# Patient Record
Sex: Male | Born: 1980 | Race: White | Hispanic: No | Marital: Married | Smoking: Current every day smoker
Health system: Southern US, Community
[De-identification: ages and names within clinical notes are randomized; demographics above are authoritative.]

## PROBLEM LIST (undated history)

## (undated) DIAGNOSIS — B9562 Methicillin resistant Staphylococcus aureus infection as the cause of diseases classified elsewhere: Secondary | ICD-10-CM

## (undated) DIAGNOSIS — L0390 Cellulitis, unspecified: Secondary | ICD-10-CM

## (undated) HISTORY — PX: HAND SURGERY: SHX662

---

## 2004-07-21 ENCOUNTER — Emergency Department: Payer: Self-pay | Admitting: General Practice

## 2005-02-21 ENCOUNTER — Emergency Department: Payer: Self-pay | Admitting: Emergency Medicine

## 2005-05-12 ENCOUNTER — Emergency Department: Payer: Self-pay | Admitting: Emergency Medicine

## 2005-12-12 ENCOUNTER — Emergency Department: Payer: Self-pay | Admitting: Emergency Medicine

## 2006-04-06 ENCOUNTER — Emergency Department: Payer: Self-pay | Admitting: Emergency Medicine

## 2006-06-02 ENCOUNTER — Emergency Department: Payer: Self-pay | Admitting: Emergency Medicine

## 2006-10-27 ENCOUNTER — Emergency Department: Payer: Self-pay | Admitting: Emergency Medicine

## 2006-12-01 ENCOUNTER — Emergency Department: Payer: Self-pay | Admitting: Emergency Medicine

## 2006-12-29 ENCOUNTER — Emergency Department: Payer: Self-pay | Admitting: Emergency Medicine

## 2007-01-01 ENCOUNTER — Emergency Department: Payer: Self-pay | Admitting: Emergency Medicine

## 2007-11-09 ENCOUNTER — Emergency Department (HOSPITAL_COMMUNITY): Admission: EM | Admit: 2007-11-09 | Discharge: 2007-11-09 | Payer: Self-pay | Admitting: Emergency Medicine

## 2007-11-09 ENCOUNTER — Encounter (INDEPENDENT_AMBULATORY_CARE_PROVIDER_SITE_OTHER): Payer: Self-pay | Admitting: Orthopedic Surgery

## 2007-12-03 ENCOUNTER — Emergency Department: Payer: Self-pay | Admitting: Emergency Medicine

## 2008-05-05 ENCOUNTER — Emergency Department: Payer: Self-pay | Admitting: Emergency Medicine

## 2008-05-18 ENCOUNTER — Emergency Department: Payer: Self-pay | Admitting: Emergency Medicine

## 2008-06-29 ENCOUNTER — Emergency Department: Payer: Self-pay | Admitting: Emergency Medicine

## 2008-08-08 ENCOUNTER — Emergency Department: Payer: Self-pay | Admitting: Emergency Medicine

## 2008-08-20 ENCOUNTER — Emergency Department: Payer: Self-pay | Admitting: Unknown Physician Specialty

## 2008-08-23 ENCOUNTER — Emergency Department: Payer: Self-pay | Admitting: Emergency Medicine

## 2008-10-06 ENCOUNTER — Emergency Department: Payer: Self-pay | Admitting: Emergency Medicine

## 2008-11-23 ENCOUNTER — Emergency Department: Payer: Self-pay | Admitting: Emergency Medicine

## 2009-04-02 ENCOUNTER — Emergency Department: Payer: Self-pay

## 2009-07-27 ENCOUNTER — Emergency Department: Payer: Self-pay | Admitting: Emergency Medicine

## 2009-11-01 ENCOUNTER — Emergency Department (HOSPITAL_COMMUNITY): Admission: EM | Admit: 2009-11-01 | Discharge: 2009-11-01 | Payer: Self-pay | Admitting: Emergency Medicine

## 2009-11-08 ENCOUNTER — Emergency Department (HOSPITAL_COMMUNITY): Admission: EM | Admit: 2009-11-08 | Discharge: 2009-11-08 | Payer: Self-pay | Admitting: Emergency Medicine

## 2009-11-10 ENCOUNTER — Emergency Department: Payer: Self-pay | Admitting: Emergency Medicine

## 2009-11-15 ENCOUNTER — Emergency Department: Payer: Self-pay

## 2009-11-17 ENCOUNTER — Emergency Department (HOSPITAL_COMMUNITY): Admission: EM | Admit: 2009-11-17 | Discharge: 2009-11-17 | Payer: Self-pay | Admitting: Emergency Medicine

## 2009-12-05 ENCOUNTER — Emergency Department: Payer: Self-pay | Admitting: Emergency Medicine

## 2009-12-14 ENCOUNTER — Emergency Department: Payer: Self-pay | Admitting: Emergency Medicine

## 2010-02-07 ENCOUNTER — Emergency Department: Payer: Self-pay | Admitting: Internal Medicine

## 2010-02-12 ENCOUNTER — Emergency Department: Payer: Self-pay | Admitting: Unknown Physician Specialty

## 2010-04-10 ENCOUNTER — Emergency Department: Payer: Self-pay | Admitting: Emergency Medicine

## 2010-05-09 ENCOUNTER — Emergency Department (HOSPITAL_COMMUNITY)
Admission: EM | Admit: 2010-05-09 | Discharge: 2010-05-09 | Payer: Self-pay | Source: Home / Self Care | Admitting: Radiation Oncology

## 2010-05-12 ENCOUNTER — Emergency Department: Payer: Self-pay | Admitting: Emergency Medicine

## 2010-05-18 ENCOUNTER — Emergency Department: Payer: Self-pay | Admitting: Emergency Medicine

## 2010-06-07 ENCOUNTER — Emergency Department: Payer: Self-pay | Admitting: Unknown Physician Specialty

## 2010-06-09 ENCOUNTER — Emergency Department (HOSPITAL_COMMUNITY)
Admission: EM | Admit: 2010-06-09 | Discharge: 2010-06-09 | Payer: Self-pay | Source: Home / Self Care | Admitting: Emergency Medicine

## 2010-07-02 ENCOUNTER — Emergency Department (HOSPITAL_COMMUNITY)
Admission: EM | Admit: 2010-07-02 | Discharge: 2010-07-03 | Payer: Self-pay | Source: Home / Self Care | Admitting: Emergency Medicine

## 2010-07-03 ENCOUNTER — Emergency Department: Payer: Self-pay | Admitting: Emergency Medicine

## 2010-07-12 LAB — RAPID STREP SCREEN (MED CTR MEBANE ONLY): Streptococcus, Group A Screen (Direct): NEGATIVE

## 2010-07-15 ENCOUNTER — Emergency Department: Payer: Self-pay | Admitting: Emergency Medicine

## 2010-07-25 ENCOUNTER — Emergency Department (HOSPITAL_COMMUNITY)
Admission: EM | Admit: 2010-07-25 | Discharge: 2010-07-25 | Payer: Self-pay | Source: Home / Self Care | Admitting: Emergency Medicine

## 2010-08-08 ENCOUNTER — Emergency Department: Payer: Self-pay | Admitting: Emergency Medicine

## 2010-08-12 ENCOUNTER — Emergency Department (HOSPITAL_COMMUNITY)
Admission: EM | Admit: 2010-08-12 | Discharge: 2010-08-12 | Disposition: A | Discharge status: Home / Self Care | Payer: Self-pay | Attending: Emergency Medicine | Admitting: Emergency Medicine

## 2010-08-12 DIAGNOSIS — K089 Disorder of teeth and supporting structures, unspecified: Secondary | ICD-10-CM | POA: Insufficient documentation

## 2010-08-12 DIAGNOSIS — R110 Nausea: Secondary | ICD-10-CM | POA: Insufficient documentation

## 2010-08-12 DIAGNOSIS — M273 Alveolitis of jaws: Secondary | ICD-10-CM | POA: Insufficient documentation

## 2010-09-06 ENCOUNTER — Emergency Department (HOSPITAL_COMMUNITY)
Admission: EM | Admit: 2010-09-06 | Discharge: 2010-09-06 | Disposition: A | Discharge status: Home / Self Care | Payer: Self-pay | Attending: Emergency Medicine | Admitting: Emergency Medicine

## 2010-09-06 DIAGNOSIS — Z9889 Other specified postprocedural states: Secondary | ICD-10-CM | POA: Insufficient documentation

## 2010-09-06 DIAGNOSIS — M79609 Pain in unspecified limb: Secondary | ICD-10-CM | POA: Insufficient documentation

## 2010-10-04 ENCOUNTER — Emergency Department (HOSPITAL_COMMUNITY)
Admission: EM | Admit: 2010-10-04 | Discharge: 2010-10-05 | Disposition: A | Discharge status: Home / Self Care | Payer: Self-pay | Attending: Emergency Medicine | Admitting: Emergency Medicine

## 2010-10-04 DIAGNOSIS — Z0389 Encounter for observation for other suspected diseases and conditions ruled out: Secondary | ICD-10-CM | POA: Insufficient documentation

## 2010-10-13 ENCOUNTER — Emergency Department: Payer: Self-pay | Admitting: Emergency Medicine

## 2010-11-09 NOTE — Op Note (Signed)
John Riddle, John Riddle                  ACCOUNT NO.:  [id number]   MEDICAL RECORD NO.:  [id number]           PATIENT TYPE:   LOCATION:                                 FACILITY:   PHYSICIAN:  Artist Pais. Weingold, M.D.DATE OF BIRTH:  1980/08/26   DATE OF PROCEDURE:  DATE OF DISCHARGE:                               OPERATIVE REPORT   PREOPERATIVE DIAGNOSIS:  Flexor sheath infection and chronic sinus,  right ring finger.   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   PROCEDURE:  I&D above with excision of chronic draining sinus inclusion  cyst.   SURGEON:  Artist Pais. Mina Marble, MD.   ASSISTANT:  None.   ANESTHESIA:  General.   TOURNIQUET TIME:  38 minutes without complications.   The wound was packed with 1 x 8 Xeroform.   Cultures and pathology specimen were sent.   OPERATIVE REPORT:  The patient was taken to the operating suite after  the induction of adequate general anesthesia.  The right upper extremity  was prepped and draped in sterile fashion.  An Esmarch was used to  exsanguinate the limb.  Tourniquet was inflated to 250 mmHg.  At this  point in time, an incision was made, Loletha Carrow type, over the ring finger  starting in the metacarpophalangeal joint area going to the level of the  middle phalanx.  Flaps were raised accordingly.  There was significant  purulence seen throughout the entire flexor sheath from the level of the  A2 pulley proximally to the A4 pulley.  This was carefully debrided.  This was what appears to be a chronic draining sinus and evidence of an  inclusion cyst between the A2 and A4 pulleys.  At this point in time,  the neurovascular bundles were identified proximally and distally and  faced into the zone of chronic infection and scar.  They were carefully  neurolysed through this area.  Once they were identified and retracted,  this entire area of  chronic scar and infection was carefully removed  using tenotomy scissors and a rongeur.  The sheath was thoroughly  irrigated with a liter of normal saline, and the wound at this point in  time was closed loosely with 4-0 nylon over a 1 x 8 Xeroform that was  used as packing.  The hand was then dressed with 4 x 4s fluffs and  compressive dressing.  The patient tolerated the procedures well, went  to recovery room in stable fashion.     Artist Pais Mina Marble, M.D.  Electronically Signed    MAW/MEDQ  D:  11/09/2007  T:  11/10/2007  Job:  [id number]

## 2010-11-09 NOTE — Consult Note (Signed)
[name], [name]                  ACCOUNT NO.:  [id number]   MEDICAL RECORD NO.:  [id number]          PATIENT TYPE:  INP   LOCATION:  1824                         FACILITY:  [facility]   PHYSICIAN:  Artist Pais. Weingold, M.D.DATE OF BIRTH:  1980/12/23   DATE OF CONSULTATION:  11/09/2007  DATE OF DISCHARGE:  11/09/2007                                 CONSULTATION   REQUESTING PHYSICIAN:  Markham Jordan L. Effie Shy, M.D.   REASON FOR CONSULTATION:  Mr. Andrik Sandt is a 30 year old right-hand  dominant male who presents today for what appears be a chronic infection  in his right ring finger involving flexor sheath with what appears to be  a chronic sinus.  He is 30 years old.  He has an allergy to BACTRIM.  He  is currently taking no medications.  No recent hospitalizations or  surgery except for an I&D of the same ring finger about a year ago in  [place] that required two I&Ds.  He occasionally smokes and drinks.  No significant past medical or surgical history.  Family medical history  is noncontributory.   PHYSICAL EXAMINATION:  GENERAL:  Reveals well-developed, well-nourished  male, pleasant, alert and oriented x3.  Examination of his ring finger  on the right, he has global swelling palmarly over the proximal phalanx.  Kanavel signs are positive in that area.  No streaking down the arm. No  adenopathy.   X-rays are pending.  Labs are pending.   IMPRESSION:  A 30 year old male with an obvious infection of flexor  sheath on the right ring finger, dominant hand, with what appears to be  a chronic sinus.  At this point in time, I recommend we take him to the  operating room for exploration, drainage, etc.  He understands the risks  and benefits and __________ of the previously mentioned procedure.      Artist Pais Mina Marble, M.D.  Electronically Signed     MAW/MEDQ  D:  11/09/2007  T:  11/10/2007  Job:  [id number]

## 2011-01-01 ENCOUNTER — Emergency Department (HOSPITAL_COMMUNITY): Payer: Self-pay

## 2011-01-01 ENCOUNTER — Emergency Department (HOSPITAL_COMMUNITY)
Admission: EM | Admit: 2011-01-01 | Discharge: 2011-01-01 | Disposition: A | Discharge status: Home / Self Care | Payer: Self-pay | Attending: Emergency Medicine | Admitting: Emergency Medicine

## 2011-01-01 DIAGNOSIS — T148XXA Other injury of unspecified body region, initial encounter: Secondary | ICD-10-CM | POA: Insufficient documentation

## 2011-01-01 DIAGNOSIS — M549 Dorsalgia, unspecified: Secondary | ICD-10-CM | POA: Insufficient documentation

## 2011-01-01 DIAGNOSIS — W1789XA Other fall from one level to another, initial encounter: Secondary | ICD-10-CM | POA: Insufficient documentation

## 2011-03-24 LAB — ANAEROBIC CULTURE

## 2011-03-24 LAB — CBC
HCT: 40.7 % (ref 39.0–52.0)
Hemoglobin: 14.5 g/dL (ref 13.0–17.0)
MCHC: 35.7 g/dL (ref 30.0–36.0)
MCV: 92.3 fL (ref 78.0–100.0)
Platelets: 251 10*3/uL (ref 150–400)
RBC: 4.41 MIL/uL (ref 4.22–5.81)
RDW: 12.5 % (ref 11.5–15.5)
WBC: 10 10*3/uL (ref 4.0–10.5)

## 2011-03-24 LAB — BASIC METABOLIC PANEL
BUN: 10 mg/dL (ref 6–23)
CO2: 24 mEq/L (ref 19–32)
Calcium: 9 mg/dL (ref 8.4–10.5)
Chloride: 105 mEq/L (ref 96–112)
Creatinine, Ser: 0.7 mg/dL (ref 0.4–1.5)
GFR calc Af Amer: >60 mL/min (ref >60)
GFR calc non Af Amer: >60 mL/min (ref >60)
Glucose, Bld: 99 mg/dL (ref 70–99)
Potassium: 4 mEq/L (ref 3.5–5.1)
Sodium: 136 mEq/L (ref 135–145)

## 2011-03-24 LAB — DIFFERENTIAL
Basophils Absolute: 0.1 10*3/uL (ref 0.0–0.1)
Basophils Relative: 1 % (ref 0–1)
Eosinophils Absolute: 0.3 10*3/uL (ref 0.0–0.7)
Eosinophils Relative: 3 % (ref 0–5)
Lymphocytes Relative: 19 % (ref 12–46)
Lymphs Abs: 1.9 10*3/uL (ref 0.7–4.0)
Monocytes Absolute: 0.6 10*3/uL (ref 0.1–1.0)
Monocytes Relative: 6 % (ref 3–12)
Neutro Abs: 7.1 10*3/uL (ref 1.7–7.7)
Neutrophils Relative %: 71 % (ref 43–77)

## 2011-03-24 LAB — GRAM STAIN

## 2011-03-24 LAB — WOUND CULTURE

## 2011-03-24 LAB — PROTIME-INR
INR: 1 (ref 0.00–1.49)
Prothrombin Time: 13 seconds (ref 11.6–15.2)

## 2011-03-29 ENCOUNTER — Emergency Department: Payer: Self-pay | Admitting: Emergency Medicine

## 2011-06-16 ENCOUNTER — Emergency Department: Payer: Self-pay | Admitting: Internal Medicine

## 2011-07-18 ENCOUNTER — Emergency Department: Payer: Self-pay | Admitting: Emergency Medicine

## 2011-11-15 ENCOUNTER — Emergency Department: Payer: Self-pay | Admitting: Emergency Medicine

## 2012-07-31 ENCOUNTER — Emergency Department: Payer: Self-pay | Admitting: Emergency Medicine

## 2012-08-22 ENCOUNTER — Emergency Department: Payer: Self-pay | Admitting: Unknown Physician Specialty

## 2012-08-24 ENCOUNTER — Emergency Department: Payer: Self-pay | Admitting: Emergency Medicine

## 2012-11-19 ENCOUNTER — Emergency Department: Payer: Self-pay | Admitting: Emergency Medicine

## 2013-01-11 ENCOUNTER — Emergency Department: Payer: Self-pay | Admitting: Emergency Medicine

## 2013-01-13 ENCOUNTER — Emergency Department: Payer: Self-pay | Admitting: Internal Medicine

## 2013-01-18 ENCOUNTER — Emergency Department: Payer: Self-pay | Admitting: Emergency Medicine

## 2013-03-08 ENCOUNTER — Telehealth: Payer: Self-pay | Admitting: *Deleted

## 2013-03-08 NOTE — Telephone Encounter (Signed)
[facility] called with critical lab for patient. Low potassium 2.7.

## 2013-04-28 ENCOUNTER — Emergency Department: Payer: Self-pay | Admitting: Emergency Medicine

## 2013-05-01 ENCOUNTER — Emergency Department: Payer: Self-pay | Admitting: Emergency Medicine

## 2013-05-01 LAB — COMPREHENSIVE METABOLIC PANEL
Albumin: 3.6 g/dL (ref 3.4–5.0)
Alkaline Phosphatase: 78 U/L (ref 50–136)
Anion Gap: 0 — ABNORMAL LOW (ref 7–16)
BUN: 7 mg/dL (ref 7–18)
Bilirubin,Total: 0.3 mg/dL (ref 0.2–1.0)
Calcium, Total: 9.1 mg/dL (ref 8.5–10.1)
Chloride: 104 mmol/L (ref 98–107)
Co2: 32 mmol/L (ref 21–32)
Creatinine: 0.92 mg/dL (ref 0.60–1.30)
EGFR (African American): >60
EGFR (Non-African Amer.): >60
Glucose: 91 mg/dL (ref 65–99)
Osmolality: 270 (ref 275–301)
Potassium: 3.8 mmol/L (ref 3.5–5.1)
SGOT(AST): 14 U/L — ABNORMAL LOW (ref 15–37)
SGPT (ALT): 14 U/L (ref 12–78)
Sodium: 136 mmol/L (ref 136–145)
Total Protein: 7.8 g/dL (ref 6.4–8.2)

## 2013-05-01 LAB — CBC
HCT: 44.3 % (ref 40.0–52.0)
HGB: 15.7 g/dL (ref 13.0–18.0)
MCH: 32.4 pg (ref 26.0–34.0)
MCHC: 35.4 g/dL (ref 32.0–36.0)
MCV: 92 fL (ref 80–100)
Platelet: 292 10*3/uL (ref 150–440)
RBC: 4.83 10*6/uL (ref 4.40–5.90)
RDW: 12.9 % (ref 11.5–14.5)
WBC: 9.9 10*3/uL (ref 3.8–10.6)

## 2013-05-01 LAB — SEDIMENTATION RATE: Erythrocyte Sed Rate: 6 mm/hr (ref 0–15)

## 2014-03-28 ENCOUNTER — Emergency Department: Payer: Self-pay | Admitting: Internal Medicine

## 2014-06-21 ENCOUNTER — Emergency Department: Payer: Self-pay | Admitting: Emergency Medicine

## 2014-07-12 ENCOUNTER — Emergency Department: Payer: Self-pay | Admitting: Emergency Medicine

## 2014-08-19 ENCOUNTER — Emergency Department: Payer: Self-pay | Admitting: Student

## 2014-11-18 DIAGNOSIS — H9203 Otalgia, bilateral: Secondary | ICD-10-CM | POA: Insufficient documentation

## 2014-11-18 DIAGNOSIS — J0100 Acute maxillary sinusitis, unspecified: Secondary | ICD-10-CM | POA: Insufficient documentation

## 2014-11-18 NOTE — ED Notes (Signed)
Pt in with co congestion since Sunday, ears are muffled, "different taste in mouth", bilat earaches.  Pt does have non productive cough,

## 2014-11-19 ENCOUNTER — Encounter: Payer: Self-pay | Admitting: Emergency Medicine

## 2014-11-19 ENCOUNTER — Emergency Department
Admission: EM | Admit: 2014-11-19 | Discharge: 2014-11-19 | Disposition: A | Discharge status: Home / Self Care | Payer: Self-pay | Attending: Emergency Medicine | Admitting: Emergency Medicine

## 2014-11-19 DIAGNOSIS — J0100 Acute maxillary sinusitis, unspecified: Secondary | ICD-10-CM

## 2014-11-19 MED ORDER — KETOROLAC TROMETHAMINE 10 MG PO TABS
ORAL_TABLET | ORAL | Status: AC
  Administered 2014-11-19: 10 mg via ORAL
  Filled 2014-11-19: qty 1

## 2014-11-19 MED ORDER — AMOXICILLIN-POT CLAVULANATE 875-125 MG PO TABS: 1.0000 | ORAL_TABLET | Freq: Two times a day (BID) | ORAL | Status: DC

## 2014-11-19 MED ORDER — KETOROLAC TROMETHAMINE 10 MG PO TABS
10.0000 mg | ORAL_TABLET | Freq: Once | ORAL | Status: AC
  Administered 2014-11-19: 10 mg via ORAL

## 2014-11-19 MED ORDER — KETOROLAC TROMETHAMINE 10 MG PO TABS: 10.0000 mg | ORAL_TABLET | Freq: Three times a day (TID) | ORAL | Status: DC | PRN

## 2014-11-19 MED ORDER — AMOXICILLIN-POT CLAVULANATE 875-125 MG PO TABS
ORAL_TABLET | ORAL | Status: AC
  Administered 2014-11-19: 1 via ORAL
  Filled 2014-11-19: qty 1

## 2014-11-19 MED ORDER — AMOXICILLIN-POT CLAVULANATE 875-125 MG PO TABS
1.0000 | ORAL_TABLET | Freq: Once | ORAL | Status: AC
  Administered 2014-11-19: 1 via ORAL

## 2014-11-19 NOTE — Discharge Instructions (Signed)

## 2014-11-19 NOTE — ED Provider Notes (Signed)
[facility] Emergency Department Provider Note  ____________________________________________  Time seen: 12:15 AM  I have reviewed the triage vital signs and the nursing notes.   HISTORY  Chief Complaint Nasal Congestion     HPI John Riddle is a 34 y.o. male presents with nasal congestion and nonproductive cough bilateral ear pain worse on the right 2 days. Patient denies fever positive tobacco use    No past medical history on file.  There are no active problems to display for this patient.   No past surgical history on file.  Current Outpatient Rx  Name  Route  Sig  Dispense  Refill  . amoxicillin-clavulanate (AUGMENTIN) 875-125 MG per tablet   Oral   Take 1 tablet by mouth 2 (two) times daily.   20 tablet   0   . ketorolac (TORADOL) 10 MG tablet   Oral   Take 1 tablet (10 mg total) by mouth every 8 (eight) hours as needed.   20 tablet   0     Allergies Bactrim; Hydrocodone; and Clindamycin/lincomycin  No family history on file.  Social History History  Substance Use Topics  . Smoking status: Not on file  . Smokeless tobacco: Not on file  . Alcohol Use: Not on file    Review of Systems  Constitutional: Negative for fever. Eyes: Negative for visual changes. ENT: Negative for sore throat. Positive ear pain positive facial pain Cardiovascular: Negative for chest pain. Respiratory: Negative for shortness of breath. Positive cough Gastrointestinal: Negative for abdominal pain, vomiting and diarrhea. Genitourinary: Negative for dysuria. Musculoskeletal: Negative for back pain. Skin: Negative for rash. Neurological: Negative for headaches, focal weakness or numbness.   10-point ROS otherwise negative.  ____________________________________________   PHYSICAL EXAM:  VITAL SIGNS: ED Triage Vitals  Enc Vitals Group     BP 11/18/14 2204 106/76 mmHg     Pulse Rate 11/18/14 2204 88     Resp 11/18/14 2204 18     Temp  11/18/14 2204 98.2 F (36.8 C)     Temp Source 11/18/14 2204 Oral     SpO2 11/18/14 2204 100 %     Weight 11/18/14 2204 190 lb (86.183 kg)     Height 11/18/14 2204 6' (1.829 m)     Head Cir --      Peak Flow --      Pain Score 11/18/14 2205 8     Pain Loc --      Pain Edu? --      Excl. in GC? --      Constitutional: Alert and oriented. Well appearing and in no distress. Eyes: Conjunctivae are normal. PERRL. Normal extraocular movements. ENT   Head: Normocephalic and atraumatic.   Nose: No congestion/rhinnorhea. Pain with percussion of the maxillary sinus   Mouth/Throat: Mucous membranes are moist.   Neck: No stridor. Ear: Clear fluid noted bilaterally bulging TMs Hematological/Lymphatic/Immunilogical: Positive right preauricular lymphadenopathy Cardiovascular: Normal rate, regular rhythm. Normal and symmetric distal pulses are present in all extremities. No murmurs, rubs, or gallops. Respiratory: Normal respiratory effort without tachypnea nor retractions. Breath sounds are clear and equal bilaterally. No wheezes/rales/rhonchi. Gastrointestinal: Soft and nontender. No distention. There is no CVA tenderness. Genitourinary: deferred Musculoskeletal: Nontender with normal range of motion in all extremities. No joint effusions.  No lower extremity tenderness nor edema. Neurologic:  Normal speech and language. No gross focal neurologic deficits are appreciated. Speech is normal.  Skin:  Skin is warm, dry and intact. No rash noted.  Psychiatric: Mood and affect are normal. Speech and behavior are normal. Patient exhibits appropriate insight and judgment.  ____________________________________________            INITIAL IMPRESSION / ASSESSMENT AND PLAN / ED COURSE  Pertinent labs & imaging results that were available during my care of the patient were reviewed by me and considered in my medical decision making (see chart for details).  History of physical exam  consistent with acute sinusitis as such patient will receive Augmentin 875 mg in the emergency department and Toradol 10 mg by mouth for discomfort  ____________________________________________   FINAL CLINICAL IMPRESSION(S) / ED DIAGNOSES  Final diagnoses:  Acute maxillary sinusitis, recurrence not specified      Darci Current, MD 11/19/14 [id number]

## 2014-11-19 NOTE — ED Notes (Signed)

## 2015-01-10 ENCOUNTER — Emergency Department
Admission: EM | Admit: 2015-01-10 | Discharge: 2015-01-10 | Disposition: A | Discharge status: Home / Self Care | Payer: Self-pay | Attending: Emergency Medicine | Admitting: Emergency Medicine

## 2015-01-10 ENCOUNTER — Emergency Department: Payer: Self-pay

## 2015-01-10 ENCOUNTER — Other Ambulatory Visit: Payer: Self-pay

## 2015-01-10 ENCOUNTER — Encounter: Payer: Self-pay | Admitting: Emergency Medicine

## 2015-01-10 DIAGNOSIS — M940 Chondrocostal junction syndrome [Tietze]: Secondary | ICD-10-CM | POA: Insufficient documentation

## 2015-01-10 DIAGNOSIS — Z792 Long term (current) use of antibiotics: Secondary | ICD-10-CM | POA: Insufficient documentation

## 2015-01-10 DIAGNOSIS — Z720 Tobacco use: Secondary | ICD-10-CM | POA: Insufficient documentation

## 2015-01-10 HISTORY — DX: Cellulitis, unspecified: L03.90

## 2015-01-10 HISTORY — DX: Methicillin resistant Staphylococcus aureus infection as the cause of diseases classified elsewhere: B95.62

## 2015-01-10 MED ORDER — KETOROLAC TROMETHAMINE 10 MG PO TABS: 10.0000 mg | ORAL_TABLET | Freq: Four times a day (QID) | ORAL | Status: DC | PRN

## 2015-01-10 MED ORDER — KETOROLAC TROMETHAMINE 60 MG/2ML IM SOLN
60.0000 mg | Freq: Once | INTRAMUSCULAR | Status: AC
  Administered 2015-01-10: 60 mg via INTRAMUSCULAR
  Filled 2015-01-10: qty 2

## 2015-01-10 MED ORDER — DIAZEPAM 5 MG PO TABS
5.0000 mg | ORAL_TABLET | Freq: Once | ORAL | Status: AC
  Administered 2015-01-10: 5 mg via ORAL
  Filled 2015-01-10: qty 1

## 2015-01-10 MED ORDER — DIAZEPAM 2 MG PO TABS: 2.0000 mg | ORAL_TABLET | Freq: Three times a day (TID) | ORAL | Status: AC | PRN

## 2015-01-10 NOTE — ED Notes (Signed)
Patient to ED with c/o left rib pain that woke him up this morning, denies any known injury although was working on a car yesterday. Reports pain with movement and hard to left up left arm.

## 2015-01-10 NOTE — Discharge Instructions (Signed)
Follow up with the primary care provider of your choice for symptoms that are not improving over the week. REturn to the ER for symptoms that change or worsen if you are unable to schedule an appointment.

## 2015-01-10 NOTE — ED Provider Notes (Signed)
[facility] Emergency Department Provider Note ____________________________________________  Time seen: Approximately 7:21 PM  I have reviewed the triage vital signs and the nursing notes.   HISTORY  Chief Complaint Rib Injury   HPI John Riddle is a 34 y.o. male who presents to the emergency department for pain in the left rib. No known injury. No relief with tylenol or ibuprofen. Pain does not radiate. No recent illness. Worsens with cough or movement.   Past Medical History  Diagnosis Date  . MRSA cellulitis     There are no active problems to display for this patient.   History reviewed. No pertinent past surgical history.  Current Outpatient Rx  Name  Route  Sig  Dispense  Refill  . amoxicillin-clavulanate (AUGMENTIN) 875-125 MG per tablet   Oral   Take 1 tablet by mouth 2 (two) times daily.   20 tablet   0   . diazepam (VALIUM) 2 MG tablet   Oral   Take 1 tablet (2 mg total) by mouth every 8 (eight) hours as needed for muscle spasms.   12 tablet   0   . ketorolac (TORADOL) 10 MG tablet   Oral   Take 1 tablet (10 mg total) by mouth every 6 (six) hours as needed.   20 tablet   0     Allergies Bactrim; Hydrocodone; and Clindamycin/lincomycin  History reviewed. No pertinent family history.  Social History History  Substance Use Topics  . Smoking status: Current Every Day Smoker -- 1.00 packs/day  . Smokeless tobacco: Not on file  . Alcohol Use: No    Review of Systems Constitutional: No recent illness. Eyes: No visual changes. ENT: No sore throat. Cardiovascular: Denies chest pain or palpitations. Respiratory: Denies shortness of breath. Gastrointestinal: No abdominal pain.  Genitourinary: Negative for dysuria. Musculoskeletal: Pain in left rib area. Skin: Negative for rash. Neurological: Negative for headaches, focal weakness or numbness. 10-point ROS otherwise  negative.  ____________________________________________   PHYSICAL EXAM:  VITAL SIGNS: ED Triage Vitals  Enc Vitals Group     BP 01/10/15 1750 111/75 mmHg     Pulse Rate 01/10/15 1750 80     Resp 01/10/15 1750 18     Temp 01/10/15 1750 98.9 F (37.2 C)     Temp Source 01/10/15 1750 Oral     SpO2 01/10/15 1750 96 %     Weight 01/10/15 1750 175 lb (79.379 kg)     Height 01/10/15 1750 6' (1.829 m)     Head Cir --      Peak Flow --      Pain Score 01/10/15 1757 10     Pain Loc --      Pain Edu? --      Excl. in GC? --     Constitutional: Alert and oriented. Well appearing and in no acute distress. Eyes: Conjunctivae are normal. EOMI. Head: Atraumatic. Nose: No congestion/rhinnorhea. Neck: No stridor.  Respiratory: Normal respiratory effort.   Musculoskeletal: Tenderness to left lower thorax without obvious injury.  Neurologic:  Normal speech and language. No gross focal neurologic deficits are appreciated. Speech is normal. No gait instability. Skin:  Skin is warm, dry and intact. Atraumatic. Psychiatric: Mood and affect are normal. Speech and behavior are normal.  ____________________________________________   LABS (all labs ordered are listed, but only abnormal results are displayed)  Labs Reviewed - No data to display ____________________________________________  RADIOLOGY  No acute abnormality. ____________________________________________   PROCEDURES  Procedure(s) performed: None  EKG:  Normal sinus rhythm; rate of 75; normal ST segments; normal axis;   ____________________________________________   INITIAL IMPRESSION / ASSESSMENT AND PLAN / ED COURSE  Pertinent labs & imaging results that were available during my care of the patient were reviewed by me and considered in my medical decision making (see chart for details).  IM toradol and po valium were given in the emergency department.  Patient is to follow up with primary care for symptoms  that are not improving over the week or return to the emergency department for symptoms that change or worsen. ____________________________________________   FINAL CLINICAL IMPRESSION(S) / ED DIAGNOSES  Final diagnoses:  Costochondritis, acute       Chinita Pester, FNP 01/10/15 1943  Loleta Rose, MD 01/10/15 2206

## 2015-01-12 ENCOUNTER — Emergency Department: Payer: Self-pay

## 2015-01-12 ENCOUNTER — Encounter: Payer: Self-pay | Admitting: *Deleted

## 2015-01-12 ENCOUNTER — Emergency Department
Admission: EM | Admit: 2015-01-12 | Discharge: 2015-01-12 | Disposition: A | Discharge status: Home / Self Care | Payer: Self-pay | Attending: Student | Admitting: Student

## 2015-01-12 DIAGNOSIS — R079 Chest pain, unspecified: Secondary | ICD-10-CM | POA: Insufficient documentation

## 2015-01-12 DIAGNOSIS — Z720 Tobacco use: Secondary | ICD-10-CM | POA: Insufficient documentation

## 2015-01-12 DIAGNOSIS — Z792 Long term (current) use of antibiotics: Secondary | ICD-10-CM | POA: Insufficient documentation

## 2015-01-12 DIAGNOSIS — J159 Unspecified bacterial pneumonia: Secondary | ICD-10-CM | POA: Insufficient documentation

## 2015-01-12 DIAGNOSIS — J189 Pneumonia, unspecified organism: Secondary | ICD-10-CM

## 2015-01-12 LAB — CBC WITH DIFFERENTIAL/PLATELET
Basophils Absolute: 0 10*3/uL (ref 0–0.1)
Basophils Relative: 0 %
Eosinophils Absolute: 0.2 10*3/uL (ref 0–0.7)
Eosinophils Relative: 2 %
HCT: 41.6 % (ref 40.0–52.0)
Hemoglobin: 14 g/dL (ref 13.0–18.0)
Lymphocytes Relative: 14 %
Lymphs Abs: 1.6 10*3/uL (ref 1.0–3.6)
MCH: 30.5 pg (ref 26.0–34.0)
MCHC: 33.5 g/dL (ref 32.0–36.0)
MCV: 91.2 fL (ref 80.0–100.0)
Monocytes Absolute: 0.9 10*3/uL (ref 0.2–1.0)
Monocytes Relative: 8 %
Neutro Abs: 8.8 10*3/uL — ABNORMAL HIGH (ref 1.4–6.5)
Neutrophils Relative %: 76 %
Platelets: 189 10*3/uL (ref 150–440)
RBC: 4.57 MIL/uL (ref 4.40–5.90)
RDW: 13.2 % (ref 11.5–14.5)
WBC: 11.5 10*3/uL — ABNORMAL HIGH (ref 3.8–10.6)

## 2015-01-12 LAB — BASIC METABOLIC PANEL
Anion gap: 10 (ref 5–15)
BUN: 14 mg/dL (ref 6–20)
CO2: 25 mmol/L (ref 22–32)
Calcium: 9 mg/dL (ref 8.9–10.3)
Chloride: 103 mmol/L (ref 101–111)
Creatinine, Ser: 0.83 mg/dL (ref 0.61–1.24)
GFR calc Af Amer: >60 mL/min (ref >60)
GFR calc non Af Amer: >60 mL/min (ref >60)
Glucose, Bld: 113 mg/dL — ABNORMAL HIGH (ref 65–99)
Potassium: 3.1 mmol/L — ABNORMAL LOW (ref 3.5–5.1)
Sodium: 138 mmol/L (ref 135–145)

## 2015-01-12 MED ORDER — AZITHROMYCIN 250 MG PO TABS: ORAL_TABLET | ORAL | Status: DC

## 2015-01-12 MED ORDER — MORPHINE SULFATE 4 MG/ML IJ SOLN
4.0000 mg | Freq: Once | INTRAMUSCULAR | Status: AC
  Administered 2015-01-12: 4 mg via INTRAVENOUS

## 2015-01-12 MED ORDER — SODIUM CHLORIDE 0.9 % IV BOLUS (SEPSIS)
1000.0000 mL | Freq: Once | INTRAVENOUS | Status: AC
  Administered 2015-01-12: 1000 mL via INTRAVENOUS

## 2015-01-12 MED ORDER — ONDANSETRON HCL 4 MG/2ML IJ SOLN
4.0000 mg | Freq: Once | INTRAMUSCULAR | Status: AC
  Administered 2015-01-12: 4 mg via INTRAVENOUS

## 2015-01-12 MED ORDER — AZITHROMYCIN 250 MG PO TABS
500.0000 mg | ORAL_TABLET | Freq: Once | ORAL | Status: AC
  Administered 2015-01-12: 500 mg via ORAL
  Filled 2015-01-12: qty 2

## 2015-01-12 MED ORDER — OXYCODONE-ACETAMINOPHEN 5-325 MG PO TABS: 1.0000 | ORAL_TABLET | Freq: Four times a day (QID) | ORAL | Status: DC | PRN

## 2015-01-12 MED ORDER — OXYCODONE-ACETAMINOPHEN 5-325 MG PO TABS
2.0000 | ORAL_TABLET | Freq: Once | ORAL | Status: AC
  Administered 2015-01-12: 2 via ORAL
  Filled 2015-01-12: qty 2

## 2015-01-12 MED ORDER — MORPHINE SULFATE 4 MG/ML IJ SOLN
INTRAMUSCULAR | Status: AC
  Administered 2015-01-12: 4 mg via INTRAVENOUS
  Filled 2015-01-12: qty 1

## 2015-01-12 MED ORDER — ONDANSETRON HCL 4 MG/2ML IJ SOLN
INTRAMUSCULAR | Status: AC
  Administered 2015-01-12: 4 mg via INTRAVENOUS
  Filled 2015-01-12: qty 2

## 2015-01-12 NOTE — ED Provider Notes (Signed)
[facility] Emergency Department Provider Note  ____________________________________________  Time seen: Approximately 2:36 AM  I have reviewed the triage vital signs and the nursing notes.   HISTORY  Chief Complaint Chest Injury    HPI John Riddle is a 34 y.o. male with no chronic medical problems who presents for evaluation of 2 days left chest/rib pain, worse with cough, movement, gradual onset, constant since onset. Current severity 10 out of 10. The patient was seen here on 01/10/2015 with chest x-ray which was unrevealing. He was discharged with pain control however his symptoms have continued. He reports nonproductive cough, no fevers but he has had chills. No vomiting, diarrhea.   Past Medical History  Diagnosis Date  . MRSA cellulitis     There are no active problems to display for this patient.   History reviewed. No pertinent past surgical history.  Current Outpatient Rx  Name  Route  Sig  Dispense  Refill  . diazepam (VALIUM) 2 MG tablet   Oral   Take 1 tablet (2 mg total) by mouth every 8 (eight) hours as needed for muscle spasms.   12 tablet   0   . ketorolac (TORADOL) 10 MG tablet   Oral   Take 1 tablet (10 mg total) by mouth every 6 (six) hours as needed.   20 tablet   0   . amoxicillin-clavulanate (AUGMENTIN) 875-125 MG per tablet   Oral   Take 1 tablet by mouth 2 (two) times daily.   20 tablet   0     Allergies Bactrim; Hydrocodone; and Clindamycin/lincomycin  History reviewed. No pertinent family history.  Social History History  Substance Use Topics  . Smoking status: Current Every Day Smoker -- 1.00 packs/day  . Smokeless tobacco: Not on file  . Alcohol Use: No    Review of Systems Constitutional: No fever; +chills Eyes: No visual changes. ENT: No sore throat. Cardiovascular: +chest pain. Respiratory: + shortness of breath. Gastrointestinal: No abdominal pain.  No nausea, no vomiting.  No diarrhea.  No  constipation. Genitourinary: Negative for dysuria. Musculoskeletal: Negative for back pain. Skin: Negative for rash. Neurological: Negative for headaches, focal weakness or numbness.  10-point ROS otherwise negative.  ____________________________________________   PHYSICAL EXAM:  VITAL SIGNS: ED Triage Vitals  Enc Vitals Group     BP 01/12/15 0010 127/85 mmHg     Pulse Rate 01/12/15 0010 87     Resp 01/12/15 0010 26     Temp 01/12/15 0010 101.6 F (38.7 C)     Temp Source 01/12/15 0010 Oral     SpO2 01/12/15 0010 98 %     Weight 01/12/15 0010 175 lb (79.379 kg)     Height 01/12/15 0010 6' (1.829 m)     Head Cir --      Peak Flow --      Pain Score 01/12/15 0009 10     Pain Loc --      Pain Edu? --      Excl. in GC? --     Constitutional: Alert and oriented. In distress secondary to pain, holding the left ribs. Eyes: Conjunctivae are normal. PERRL. EOMI. Head: Atraumatic. Nose: No congestion/rhinnorhea. Mouth/Throat: Mucous membranes are moist.  Oropharynx non-erythematous. Neck: No stridor.  Cardiovascular: Normal rate, regular rhythm. Grossly normal heart sounds.  Good peripheral circulation. Respiratory: Splinting with mild tachypnea, diminished breath sounds in the left base. Gastrointestinal: Soft and nontender. No distention. No abdominal bruits. No CVA tenderness. Genitourinary: Deferred  Musculoskeletal: No lower extremity tenderness nor edema.  No joint effusions. Tenderness to palpation throughout the left anterolateral chest wall. Neurologic:  Normal speech and language. No gross focal neurologic deficits are appreciated. No gait instability. Skin:  Skin is warm, dry and intact. No rash noted. Psychiatric: Mood and affect are normal. Speech and behavior are normal.  ____________________________________________   LABS (all labs ordered are listed, but only abnormal results are displayed)  Labs Reviewed  CBC WITH DIFFERENTIAL/PLATELET - Abnormal; Notable  for the following:    WBC 11.5 (*)    Neutro Abs 8.8 (*)    All other components within normal limits  BASIC METABOLIC PANEL - Abnormal; Notable for the following:    Potassium 3.1 (*)    Glucose, Bld 113 (*)    All other components within normal limits   ____________________________________________  EKG  ED ECG REPORT I, Gayla DossGayle, Dre Gamino A, the attending physician, personally viewed and interpreted this ECG.   Date: 01/12/2015  EKG Time: 03:44  Rate: 79  Rhythm: normal EKG, normal sinus rhythm, unchanged from previous tracings  Axis: normal  Intervals:none  ST&T Change: No acute ST segment elevation  ____________________________________________  RADIOLOGY  CXR FINDINGS: The lungs are well-aerated. Mild left basilar airspace opacity could reflect mild pneumonia. There is no evidence of pleural effusion or pneumothorax.  The heart is normal in size; the mediastinal contour is within normal limits. No acute osseous abnormalities are seen.  IMPRESSION: Mild left basilar airspace opacity could reflect mild pneumonia.  ____________________________________________   PROCEDURES  Procedure(s) performed: None  Critical Care performed: No  ____________________________________________   INITIAL IMPRESSION / ASSESSMENT AND PLAN / ED COURSE  Pertinent labs & imaging results that were available during my care of the patient were reviewed by me and considered in my medical decision making (see chart for details).  John Riddle is a 34 y.o. male with no chronic medical problems who presents for evaluation of 2 days left chest/rib pain. On exam, he is in distress secondary to pain, he is holding the left ribs. He has diminished breath sounds in the left base, he is splinting, he is tachypneic and febrile. Chest x-ray concerning for left sided pneumonia which would account for his pain, splinting, fever. He has no increased work of breathing or hypoxia therefore will tend to treat  his pain, treat with by mouth antibiotics. Anticipate discharge with incentive spirometer, pain control, antibiotics.  ----------------------------------------- 3:37 AM on 01/12/2015 -----------------------------------------  Fever resolved with antipyretics. Patient is not tachycardic. He reports significant improvement in his pain. He remains mildly tachypneic but no increased work of breathing. Still no hypoxia. We'll discharge with Percocet, incentive spirometer, azithromycin for treatment of community-acquired pneumonia. I discussed meticulous return precautions with him including immediate return precautions and he and family at bedside are comfortable with the discharge plan. ____________________________________________   FINAL CLINICAL IMPRESSION(S) / ED DIAGNOSES  Final diagnoses:  Community acquired pneumonia  Left sided chest pain      Gayla DossEryka A Akash Winski, MD 01/12/15 [phone]

## 2015-01-12 NOTE — ED Notes (Signed)
Incentive spirometry instruction provided. Pt demonstrated understanding and ability to perform. Family educated as well.

## 2015-01-12 NOTE — ED Notes (Signed)
Bedside report given to Rachel, RN.

## 2015-01-12 NOTE — ED Notes (Signed)
Patient reports seen yesterday for the same symptoms.  Reports left side rib pain, denies any type of injury.

## 2015-04-29 ENCOUNTER — Encounter: Payer: Self-pay | Admitting: Emergency Medicine

## 2015-04-29 ENCOUNTER — Emergency Department
Admission: EM | Admit: 2015-04-29 | Discharge: 2015-04-29 | Disposition: A | Discharge status: Home / Self Care | Payer: Self-pay | Attending: Emergency Medicine | Admitting: Emergency Medicine

## 2015-04-29 DIAGNOSIS — L03116 Cellulitis of left lower limb: Secondary | ICD-10-CM | POA: Insufficient documentation

## 2015-04-29 DIAGNOSIS — Z792 Long term (current) use of antibiotics: Secondary | ICD-10-CM | POA: Insufficient documentation

## 2015-04-29 DIAGNOSIS — Z720 Tobacco use: Secondary | ICD-10-CM | POA: Insufficient documentation

## 2015-04-29 MED ORDER — DOXYCYCLINE HYCLATE 100 MG PO CAPS: 100.0000 mg | ORAL_CAPSULE | Freq: Two times a day (BID) | ORAL | Status: DC

## 2015-04-29 MED ORDER — DOXYCYCLINE HYCLATE 100 MG PO TABS
100.0000 mg | ORAL_TABLET | Freq: Once | ORAL | Status: AC
  Administered 2015-04-29: 100 mg via ORAL
  Filled 2015-04-29: qty 1

## 2015-04-29 MED ORDER — CEPHALEXIN 500 MG PO CAPS: 500.0000 mg | ORAL_CAPSULE | Freq: Three times a day (TID) | ORAL | Status: AC

## 2015-04-29 MED ORDER — CEFTRIAXONE SODIUM 1 G IJ SOLR
1.0000 g | Freq: Once | INTRAMUSCULAR | Status: AC
  Administered 2015-04-29: 1 g via INTRAMUSCULAR
  Filled 2015-04-29: qty 10

## 2015-04-29 NOTE — ED Notes (Addendum)
Patient ambulatory to triage with steady gait, without difficulty or distress noted; pt reports redness/swelling to left knee x 2 days

## 2015-04-29 NOTE — ED Provider Notes (Signed)
[facility] Emergency Department Provider Note ____________________________________________  Time seen: Approximately 8:30 PM  I have reviewed the triage vital signs and the nursing notes.   HISTORY  Chief Complaint Insect Bite   HPI Morton Petersndy L Heitmeyer is a 34 y.o. male who presents to the emergency department for evaluation of left knee redness and pain.The patient noticed a "black spot" in the area 2 days ago. Last night he noticed some swelling and when he awakened this morning it was warm, red, and painful.    Past Medical History  Diagnosis Date  . MRSA cellulitis     There are no active problems to display for this patient.   History reviewed. No pertinent past surgical history.  Current Outpatient Rx  Name  Route  Sig  Dispense  Refill  . amoxicillin-clavulanate (AUGMENTIN) 875-125 MG per tablet   Oral   Take 1 tablet by mouth 2 (two) times daily.   20 tablet   0   . azithromycin (ZITHROMAX Z-PAK) 250 MG tablet      Take 1 tab PO once daily for 4 days   4 each   0   . cephALEXin (KEFLEX) 500 MG capsule   Oral   Take 1 capsule (500 mg total) by mouth 3 (three) times daily.   40 capsule   0   . diazepam (VALIUM) 2 MG tablet   Oral   Take 1 tablet (2 mg total) by mouth every 8 (eight) hours as needed for muscle spasms.   12 tablet   0   . doxycycline (VIBRAMYCIN) 100 MG capsule   Oral   Take 1 capsule (100 mg total) by mouth 2 (two) times daily.   20 capsule   0   . ketorolac (TORADOL) 10 MG tablet   Oral   Take 1 tablet (10 mg total) by mouth every 6 (six) hours as needed.   20 tablet   0   . oxyCODONE-acetaminophen (ROXICET) 5-325 MG per tablet   Oral   Take 1 tablet by mouth every 6 (six) hours as needed (may take up to 2 tabs po q6h prn pain).   20 tablet   0     Allergies Bactrim; Hydrocodone; and Clindamycin/lincomycin  No family history on file.  Social History Social History  Substance Use Topics  . Smoking  status: Current Every Day Smoker -- 1.00 packs/day  . Smokeless tobacco: None  . Alcohol Use: No    Review of Systems   Constitutional: No fever/chills Eyes: No visual changes. ENT: No congestion or rhinorrhea Cardiovascular: Denies chest pain. Respiratory: Denies shortness of breath. Gastrointestinal: No abdominal pain.  No nausea, no vomiting.  No diarrhea.  No constipation. Genitourinary: Negative for dysuria. Musculoskeletal: Negative for back pain. Skin: Positive for redness and pain to the left knee Neurological: Negative for headaches, focal weakness or numbness.  10-point ROS otherwise negative.  ____________________________________________   PHYSICAL EXAM:  VITAL SIGNS: ED Triage Vitals  Enc Vitals Group     BP 04/29/15 1956 116/66 mmHg     Pulse Rate 04/29/15 1956 63     Resp 04/29/15 1956 18     Temp 04/29/15 1956 97.7 F (36.5 C)     Temp Source 04/29/15 1956 Oral     SpO2 04/29/15 1956 99 %     Weight 04/29/15 1956 185 lb (83.915 kg)     Height 04/29/15 1956 6\' 1"  (1.854 m)     Head Cir --  Peak Flow --      Pain Score 04/29/15 1956 8     Pain Loc --      Pain Edu? --      Excl. in GC? --     Constitutional: Alert and oriented. Well appearing and in no acute distress. Eyes: Conjunctivae are normal. PERRL. EOMI. Head: Atraumatic. Nose: No congestion/rhinnorhea. Mouth/Throat: Mucous membranes are moist.  Oropharynx non-erythematous. No oral lesions. Neck: No stridor. Cardiovascular: Normal rate, regular rhythm.  Good peripheral circulation. Respiratory: Normal respiratory effort.  No retractions. Lungs CTAB. Gastrointestinal: Soft and nontender. No distention. No abdominal bruits.  Musculoskeletal: No lower extremity tenderness nor edema.  No joint effusions. Neurologic:  Normal speech and language. No gross focal neurologic deficits are appreciated. Speech is normal. No gait instability. Skin:  Anterior knee is mildly edematous, with erythema  that extends to the lateral and medial sides of the knee, there is no lymphangitis noted, there is a pinpoint blackened area central to the erythema, no fluctuance or induration; Negative for petechiae.  Psychiatric: Mood and affect are normal. Speech and behavior are normal.  ____________________________________________   LABS (all labs ordered are listed, but only abnormal results are displayed)  Labs Reviewed - No data to display ____________________________________________  EKG   ____________________________________________  RADIOLOGY  Not indicated ____________________________________________   PROCEDURES  Procedure(s) performed: None ____________________________________________   INITIAL IMPRESSION / ASSESSMENT AND PLAN / ED COURSE  Pertinent labs & imaging results that were available during my care of the patient were reviewed by me and considered in my medical decision making (see chart for details).  Patient was advised to take the doxycycline and Keflex until finished and as prescribed. He was advised to return to the emergency department or see primary care if the symptoms are not improving over the next 2 days. He is advised to return to the emergency department sooner for symptoms that change or worsen if he is unable to schedule an appointment. ____________________________________________   FINAL CLINICAL IMPRESSION(S) / ED DIAGNOSES  Final diagnoses:  Cellulitis of knee, left       Chinita Pester, FNP 04/29/15 2056  Myrna Blazer, MD 04/29/15 [id number]

## 2015-04-29 NOTE — Discharge Instructions (Signed)

## 2015-04-29 NOTE — ED Notes (Signed)
Pt presents with left knee redness and swelling. Small ulcer noted to knee, pt states area began with a black dot and spread to current condition. Pt states bending on knee increases pain. Denies fever. Pt does tree work for a living.

## 2015-04-29 NOTE — ED Notes (Signed)
Med hold until 2100. Pt made aware and verbalized understanding.

## 2015-11-30 ENCOUNTER — Encounter: Payer: Self-pay | Admitting: Emergency Medicine

## 2015-11-30 ENCOUNTER — Emergency Department
Admission: EM | Admit: 2015-11-30 | Discharge: 2015-11-30 | Disposition: A | Discharge status: Home / Self Care | Payer: Self-pay | Attending: Emergency Medicine | Admitting: Emergency Medicine

## 2015-11-30 DIAGNOSIS — F17200 Nicotine dependence, unspecified, uncomplicated: Secondary | ICD-10-CM | POA: Insufficient documentation

## 2015-11-30 DIAGNOSIS — B360 Pityriasis versicolor: Secondary | ICD-10-CM | POA: Insufficient documentation

## 2015-11-30 DIAGNOSIS — B354 Tinea corporis: Secondary | ICD-10-CM | POA: Insufficient documentation

## 2015-11-30 MED ORDER — FLUCONAZOLE 150 MG PO TABS: 300.0000 mg | ORAL_TABLET | ORAL | Status: DC

## 2015-11-30 MED ORDER — SELENIUM SULFIDE 2.5 % EX LOTN: 1.0000 "application " | TOPICAL_LOTION | Freq: Every day | CUTANEOUS | Status: DC | PRN

## 2015-11-30 MED ORDER — OXYCODONE-ACETAMINOPHEN 5-325 MG PO TABS: 1.0000 | ORAL_TABLET | ORAL | Status: DC | PRN

## 2015-11-30 NOTE — ED Notes (Signed)
Pt. Seen by PA-C prior to RN documenting assessment. See PA-C note.

## 2015-11-30 NOTE — Discharge Instructions (Signed)
Tinea Versicolor °Tinea versicolor is a skin infection that is caused by a type of yeast. It causes a rash that shows up as light or dark patches on the skin. It often occurs on the chest, back, neck, or upper arms. The condition usually does not cause other problems. In most cases, it goes away in a few weeks with treatment. The infection cannot be spread by person to another person. °HOME CARE °· Take medicines only as told by your doctor. °· Scrub your skin every day with a dandruff shampoo as told by your doctor. °· Do not scratch your skin in the rash area. °· Avoid places that are hot and humid. °· Do not use tanning booths. °· Try to avoid sweating a lot. °GET HELP IF: °· Your symptoms get worse. °· You have a fever. °· You have redness, swelling, or pain in the area of your rash. °· You have fluid, blood, or pus coming from your rash. °· Your rash comes back after treatment. °  °This information is not intended to replace advice given to you by your health care provider. Make sure you discuss any questions you have with your health care provider. °  °Document Released: 05/26/2008 Document Revised: 07/04/2014 Document Reviewed: 03/25/2014 °Elsevier Interactive Patient Education ©2016 [employer] ° °

## 2015-11-30 NOTE — ED Notes (Signed)
Skin irritation groin x 2 days. States is painful.

## 2015-11-30 NOTE — ED Provider Notes (Signed)
[facility] Emergency Department Provider Note  ____________________________________________  Time seen: Approximately 5:54 PM  I have reviewed the triage vital signs and the nursing notes.   HISTORY  Chief Complaint Skin Problem    HPI John Riddle is a 35 y.o. male who presents for evaluation with a rash in his groin for last 2 days. Addition patient states the rash is all over his body. Trunk chest back arms and legs. Denies any pustules or vesicles. Positive for itching.   Past Medical History  Diagnosis Date  . MRSA cellulitis     There are no active problems to display for this patient.   History reviewed. No pertinent past surgical history.  Current Outpatient Rx  Name  Route  Sig  Dispense  Refill  . diazepam (VALIUM) 2 MG tablet   Oral   Take 1 tablet (2 mg total) by mouth every 8 (eight) hours as needed for muscle spasms.   12 tablet   0   . fluconazole (DIFLUCAN) 150 MG tablet   Oral   Take 2 tablets (300 mg total) by mouth once a week.   2 tablet   0   . oxyCODONE-acetaminophen (ROXICET) 5-325 MG tablet   Oral   Take 1-2 tablets by mouth every 4 (four) hours as needed for severe pain.   15 tablet   0   . selenium sulfide (SELSUN) 2.5 % shampoo   Topical   Apply 1 application topically daily as needed for irritation.   118 mL   12     Allergies Bactrim; Hydrocodone; and Clindamycin/lincomycin  No family history on file.  Social History Social History  Substance Use Topics  . Smoking status: Current Every Day Smoker -- 1.00 packs/day  . Smokeless tobacco: None  . Alcohol Use: No    Review of Systems Constitutional: No fever/chills Cardiovascular: Denies chest pain. Respiratory: Denies shortness of breath. Musculoskeletal: Negative for back pain. Skin: For rash. Neurological: Negative for headaches, focal weakness or numbness.  10-point ROS otherwise  negative.  ____________________________________________   PHYSICAL EXAM:  VITAL SIGNS: ED Triage Vitals  Enc Vitals Group     BP 11/30/15 1735 108/71 mmHg     Pulse Rate 11/30/15 1735 84     Resp 11/30/15 1735 18     Temp 11/30/15 1735 98.2 F (36.8 C)     Temp Source 11/30/15 1735 Oral     SpO2 11/30/15 1735 95 %     Weight 11/30/15 1735 180 lb (81.647 kg)     Height 11/30/15 1735 6' (1.829 m)     Head Cir --      Peak Flow --      Pain Score 11/30/15 1737 10     Pain Loc --      Pain Edu? --      Excl. in GC? --     Constitutional: Alert and oriented. Well appearing and in no acute distress. Eyes: Conjunctivae are normal. PERRL. EOMI. Head: Atraumatic. Nose: No congestion/rhinnorhea. Mouth/Throat: Mucous membranes are moist.  Oropharynx non-erythematous. Neck: No stridor.   Cardiovascular: Normal rate, regular rhythm. Grossly normal heart sounds.  Good peripheral circulation. Respiratory: Normal respiratory effort.  No retractions. Lungs CTAB. Musculoskeletal: No lower extremity tenderness nor edema.  No joint effusions. Neurologic:  Normal speech and language. No gross focal neurologic deficits are appreciated. No gait instability. Skin:  Skin is warm, dry and intactmacular papular rash consistent with a tinea corporis and tinea versicolor Speech and  behavior are normal.  ____________________________________________   LABS (all labs ordered are listed, but only abnormal results are displayed)  Labs Reviewed - No data to display ____________________________________________    PROCEDURES  Procedure(s) performed: None  Critical Care performed: No  ____________________________________________   INITIAL IMPRESSION / ASSESSMENT AND PLAN / ED COURSE  Pertinent labs & imaging results that were available during my care of the patient were reviewed by me and considered in my medical decision making (see chart for details).   just patient to be cooperative 20  versicolor. Selsun lotion provided recommended Benadryl over-the-counter as needed for itching. Diflucan 300 mg once a week for 2 weeks. Follow-up with dermatology as needed.   _________________________________________   FINAL CLINICAL IMPRESSION(S) / ED DIAGNOSES  Final diagnoses:  Tinea corporis  TV (tinea versicolor)     This chart was dictated using voice recognition software/Dragon. Despite best efforts to proofread, errors can occur which can change the meaning. Any change was purely unintentional.   Evangeline Dakin, PA-C 12/01/15 1644  Rockne Menghini, MD 12/01/15 2159

## 2016-01-03 ENCOUNTER — Encounter: Payer: Self-pay | Admitting: Emergency Medicine

## 2016-01-03 ENCOUNTER — Emergency Department
Admission: EM | Admit: 2016-01-03 | Discharge: 2016-01-03 | Disposition: A | Discharge status: Home / Self Care | Payer: Self-pay | Attending: Emergency Medicine | Admitting: Emergency Medicine

## 2016-01-03 DIAGNOSIS — B9689 Other specified bacterial agents as the cause of diseases classified elsewhere: Secondary | ICD-10-CM

## 2016-01-03 DIAGNOSIS — F17200 Nicotine dependence, unspecified, uncomplicated: Secondary | ICD-10-CM | POA: Insufficient documentation

## 2016-01-03 DIAGNOSIS — L089 Local infection of the skin and subcutaneous tissue, unspecified: Secondary | ICD-10-CM | POA: Insufficient documentation

## 2016-01-03 DIAGNOSIS — B86 Scabies: Secondary | ICD-10-CM | POA: Insufficient documentation

## 2016-01-03 DIAGNOSIS — Z79899 Other long term (current) drug therapy: Secondary | ICD-10-CM | POA: Insufficient documentation

## 2016-01-03 MED ORDER — HYDROXYZINE HCL 50 MG PO TABS
50.0000 mg | ORAL_TABLET | Freq: Once | ORAL | Status: AC
  Administered 2016-01-03: 50 mg via ORAL
  Filled 2016-01-03: qty 1

## 2016-01-03 MED ORDER — TRAMADOL HCL 50 MG PO TABS: 50.0000 mg | ORAL_TABLET | Freq: Four times a day (QID) | ORAL | Status: DC | PRN

## 2016-01-03 MED ORDER — CEPHALEXIN 500 MG PO CAPS
500.0000 mg | ORAL_CAPSULE | Freq: Once | ORAL | Status: AC
  Administered 2016-01-03: 500 mg via ORAL
  Filled 2016-01-03: qty 1

## 2016-01-03 MED ORDER — PERMETHRIN 5 % EX CREA
1.0000 | TOPICAL_CREAM | Freq: Once | CUTANEOUS | Status: DC
Start: 2016-01-03 — End: 2019-04-09

## 2016-01-03 MED ORDER — HYDROXYZINE HCL 50 MG PO TABS: 50.0000 mg | ORAL_TABLET | Freq: Three times a day (TID) | ORAL | Status: DC | PRN

## 2016-01-03 NOTE — ED Provider Notes (Signed)
[facility] Emergency Department Provider Note   ____________________________________________  Time seen: Approximately 8:40 PM  I have reviewed the triage vital signs and the nursing notes.   HISTORY  Chief Complaint Rash    HPI John Riddle is a 35 y.o. male patient complaining 2 weeks of increasing red bumps on lower/upper extremity and abdomen. Patient states intense itching is increases in the evening. Patient denies any drainage from these areas. Patient states he is concerned because he had a history of MRSA. Patient denies any fever associated this complaint. No palliative measures taken for this complaint. Patient also complained of pain which he rated as a 6/10.  Past Medical History  Diagnosis Date  . MRSA cellulitis     There are no active problems to display for this patient.   Past Surgical History  Procedure Laterality Date  . Hand surgery Left     thumb    Current Outpatient Rx  Name  Route  Sig  Dispense  Refill  . diazepam (VALIUM) 2 MG tablet   Oral   Take 1 tablet (2 mg total) by mouth every 8 (eight) hours as needed for muscle spasms.   12 tablet   0   . fluconazole (DIFLUCAN) 150 MG tablet   Oral   Take 2 tablets (300 mg total) by mouth once a week.   2 tablet   0   . hydrOXYzine (ATARAX/VISTARIL) 50 MG tablet   Oral   Take 1 tablet (50 mg total) by mouth 3 (three) times daily as needed for itching.   15 tablet   0   . oxyCODONE-acetaminophen (ROXICET) 5-325 MG tablet   Oral   Take 1-2 tablets by mouth every 4 (four) hours as needed for severe pain.   15 tablet   0   . permethrin (ELIMITE) 5 % cream   Topical   Apply 1 application topically once.   60 g   0   . selenium sulfide (SELSUN) 2.5 % shampoo   Topical   Apply 1 application topically daily as needed for irritation.   118 mL   12   . traMADol (ULTRAM) 50 MG tablet   Oral   Take 1 tablet (50 mg total) by mouth every 6 (six) hours as needed  for moderate pain.   12 tablet   0     Allergies Bactrim; Hydrocodone; and Clindamycin/lincomycin  History reviewed. No pertinent family history.  Social History Social History  Substance Use Topics  . Smoking status: Current Every Day Smoker -- 1.00 packs/day  . Smokeless tobacco: Riddle  . Alcohol Use: No    Review of Systems Constitutional: No fever/chills Eyes: No visual changes. ENT: No sore throat. Cardiovascular: Denies chest pain. Respiratory: Denies shortness of breath. Gastrointestinal: No abdominal pain.  No nausea, no vomiting.  No diarrhea.  No constipation. Genitourinary: Negative for dysuria. Musculoskeletal: Negative for back pain. Skin: Positive for rash. Neurological: Negative for headaches, focal weakness or numbness. Allergic/Immunilogical: See medication list  ____________________________________________   PHYSICAL EXAM:  VITAL SIGNS: ED Triage Vitals  Enc Vitals Group     BP 01/03/16 2027 120/71 mmHg     Pulse Rate 01/03/16 2027 79     Resp 01/03/16 2027 18     Temp 01/03/16 2027 97.9 F (36.6 C)     Temp Source 01/03/16 2027 Oral     SpO2 01/03/16 2027 99 %     Weight 01/03/16 2027 185 lb (83.915 kg)  Height 01/03/16 2027 6' (1.829 m)     Head Cir --      Peak Flow --      Pain Score 01/03/16 2028 6     Pain Loc --      Pain Edu? --      Excl. in GC? --     Constitutional: Alert and oriented. Well appearing and in no acute distress. Eyes: Conjunctivae are normal. PERRL. EOMI. Head: Atraumatic. Nose: No congestion/rhinnorhea. Mouth/Throat: Mucous membranes are moist.  Oropharynx non-erythematous. Neck: No stridor.  No cervical spine tenderness to palpation. Hematological/Lymphatic/Immunilogical: No cervical lymphadenopathy. Cardiovascular: Normal rate, regular rhythm. Grossly normal heart sounds.  Good peripheral circulation. Respiratory: Normal respiratory effort.  No retractions. Lungs CTAB. Gastrointestinal: Soft and  nontender. No distention. No abdominal bruits. No CVA tenderness. Musculoskeletal: No lower extremity tenderness nor edema.  No joint effusions. Neurologic:  Normal speech and language. No gross focal neurologic deficits are appreciated. No gait instability. Skin:  Skin is warm, dry and intact. Erythematous papular lesions in a linear formation upper and lower extremities. Patient also in the medial thigh area and abdomen. Signs and symptoms of excoriation. Psychiatric: Mood and affect are normal. Speech and behavior are normal.  ____________________________________________   LABS (all labs ordered are listed, but only abnormal results are displayed)  Labs Reviewed - No data to display ____________________________________________  EKG   ____________________________________________  RADIOLOGY   ____________________________________________   PROCEDURES  Procedure(s) performed: Riddle  Procedures  Critical Care performed: No  ____________________________________________   INITIAL IMPRESSION / ASSESSMENT AND PLAN / ED COURSE  Pertinent labs & imaging results that were available during my care of the patient were reviewed by me and considered in my medical decision making (see chart for details).  Scabies with secondary skin infection. Patient given discharge care instructions. Patient given prescription for a Elimite, Atarax, Keflex, and tramadol. Patient [facility] condition persists ____________________________________________   FINAL CLINICAL IMPRESSION(S) / ED DIAGNOSES  Final diagnoses:  Scabies  Bacterial skin infection      NEW MEDICATIONS STARTED DURING THIS VISIT:  New Prescriptions   HYDROXYZINE (ATARAX/VISTARIL) 50 MG TABLET    Take 1 tablet (50 mg total) by mouth 3 (three) times daily as needed for itching.   PERMETHRIN (ELIMITE) 5 % CREAM    Apply 1 application topically once.   TRAMADOL  (ULTRAM) 50 MG TABLET    Take 1 tablet (50 mg total) by mouth every 6 (six) hours as needed for moderate pain.     Note:  This document was prepared using Dragon voice recognition software and may include unintentional dictation errors.    Joni ReiningRonald K Smith, PA-C 01/03/16 2047  Emily FilbertJonathan E Williams, MD 01/03/16 [phone]

## 2016-01-03 NOTE — ED Notes (Signed)
Pt with multiple small swollen painful areas to right axilla, bilateral groin areas, back of left leg and abd; pt denies drainage from areas; says history of MRSA years ago

## 2016-01-03 NOTE — ED Notes (Signed)
Pt has red bumps on legs, stomach, under rt arm, that itch. States he has had similar problems in past but unsure of dx.

## 2016-01-26 ENCOUNTER — Emergency Department
Admission: EM | Admit: 2016-01-26 | Discharge: 2016-01-26 | Disposition: A | Discharge status: Home / Self Care | Payer: Self-pay | Attending: Emergency Medicine | Admitting: Emergency Medicine

## 2016-01-26 ENCOUNTER — Encounter: Payer: Self-pay | Admitting: Emergency Medicine

## 2016-01-26 DIAGNOSIS — L03113 Cellulitis of right upper limb: Secondary | ICD-10-CM

## 2016-01-26 DIAGNOSIS — L03115 Cellulitis of right lower limb: Secondary | ICD-10-CM | POA: Insufficient documentation

## 2016-01-26 DIAGNOSIS — F17210 Nicotine dependence, cigarettes, uncomplicated: Secondary | ICD-10-CM | POA: Insufficient documentation

## 2016-01-26 MED ORDER — DOXYCYCLINE HYCLATE 100 MG PO TABS: 100.0000 mg | ORAL_TABLET | Freq: Two times a day (BID) | ORAL | 0 refills | Status: DC

## 2016-01-26 MED ORDER — ACETAMINOPHEN-CODEINE #3 300-30 MG PO TABS: 1.0000 | ORAL_TABLET | Freq: Four times a day (QID) | ORAL | 0 refills | Status: DC | PRN

## 2016-01-26 NOTE — ED Triage Notes (Addendum)
Patient ambulatory to triage with steady gait, without difficulty or distress noted; pt reports area of swelling/redness to inner right arm, right upper thigh; st here with wife who has similar; st was "at a field on Sunday and thinks something got a hold of him"; pt with hx MRSA

## 2016-01-26 NOTE — ED Provider Notes (Signed)
[facility] Emergency Department Provider Note ____________________________________________  Time seen: 2008  I have reviewed the triage vital signs and the nursing notes.  HISTORY  Chief Complaint  Abscess  HPI John Riddle is a 35 y.o. male with a history of MRSA, reports to the ED for evaluation of spontaneous development of 2 distinct cellulitic lesions. He describes one lesion to his right elbow with some local spread of erythema. The area he reports as being warm and tender to touch. He denies any spontaneous drainage. He has a similar lesion to the anterior right thigh, with a central scab that is also red and tender to touch. He denies any spontaneous drainage from this lesion. He denies any fevers, chills, or sweats.  Past Medical History:  Diagnosis Date  . MRSA cellulitis    There are no active problems to display for this patient.  Past Surgical History:  Procedure Laterality Date  . HAND SURGERY Left    thumb    Current Outpatient Rx  . Order #: [id number] Class: Print  . Order #: [id number] Class: Print  . Order #: [id number] Class: Print  . Order #: [id number] Class: Print  . Order #: [id number] Class: Print  . Order #: [id number] Class: Print  . Order #: [id number] Class: Print  . Order #: [id number] Class: Print    Allergies Bactrim [sulfamethoxazole-trimethoprim]; Hydrocodone; and Clindamycin/lincomycin  No family history on file.  Social History Social History  Substance Use Topics  . Smoking status: Current Every Day Smoker    Packs/day: 2.00    Types: Cigarettes  . Smokeless tobacco: Never Used  . Alcohol use No   Review of Systems  Constitutional: Negative for fever. Musculoskeletal: Negative for back pain. Skin: Negative for rash.Reports cellulitis as above. Neurological: Negative for headaches, focal weakness or numbness. ____________________________________________  PHYSICAL EXAM:  VITAL SIGNS: ED Triage Vitals  Enc  Vitals Group     BP 01/26/16 1938 136/83     Pulse Rate 01/26/16 1938 (!) 59     Resp 01/26/16 1938 20     Temp 01/26/16 1938 98.6 F (37 C)     Temp Source 01/26/16 1938 Oral     SpO2 01/26/16 1938 99 %     Weight 01/26/16 1938 185 lb (83.9 kg)     Height 01/26/16 1938 6' (1.829 m)     Head Circumference --      Peak Flow --      Pain Score 01/26/16 1939 7     Pain Loc --      Pain Edu? --      Excl. in GC? --    Constitutional: Alert and oriented. Well appearing and in no distress. Head: Normocephalic and atraumatic. Musculoskeletal: Nontender with normal range of motion in all extremities.  Neurologic:  Normal gait without ataxia. Normal speech and language. No gross focal neurologic deficits are appreciated. Skin:  Skin is warm, dry and intact. No rash noted. Patient with a distinct area of erythema and local induration to the mid thigh on the right. There is a central scab noted without any spontaneous drainage, fluctuance, or purulence. He has a similar lesion to his right elbow with some local spread of erythema. There is no lymphangitis, fluctuance, or spontaneous drainage noted. No lymph node involvement of the upper extremities appreciated. ____________________________________________  INITIAL IMPRESSION / ASSESSMENT AND PLAN / ED COURSE  Patient with an acute cellulitis to the right elbow and the right thigh consistent with probable MRSA infection. The patient has multiple drug  allergies and as such will be discharged with doxycycline. He is also provided with a prescription for Tylenol 3 for intermittent pain relief. He is advised to keep the areas clean, and covered. He will apply warm compresses to promote healing. He will be referred to [facility] for ongoing cellulitis check and routine medical care. Return precautions are reviewed.  Clinical Course    @ ____________________________________________  FINAL CLINICAL IMPRESSION(S) / ED  DIAGNOSES  Final diagnoses:  Cellulitis of right lower extremity  Cellulitis of right upper extremity     Lissa Hoard, PA-C 01/26/16 2107    Sharyn Creamer, MD 01/27/16 [phone]

## 2016-01-26 NOTE — ED Notes (Signed)
Discharge instructions reviewed with patient. Questions fielded by this RN. Patient verbalizes understanding of instructions. Patient discharged home in stable condition per Bacon PA. No acute distress noted at time of discharge.   

## 2016-01-26 NOTE — Discharge Instructions (Signed)
Take the prescription meds as directed. Apply warm compresses to promote healing. Follow-up with [facility] as needed.

## 2016-01-29 MED ORDER — ONDANSETRON HCL 4 MG/2ML IJ SOLN
INTRAMUSCULAR | Status: AC
  Filled 2016-01-29: qty 2

## 2016-02-14 ENCOUNTER — Encounter: Payer: Self-pay | Admitting: Emergency Medicine

## 2016-02-14 ENCOUNTER — Emergency Department
Admission: EM | Admit: 2016-02-14 | Discharge: 2016-02-14 | Disposition: A | Discharge status: Home / Self Care | Payer: Self-pay | Attending: Emergency Medicine | Admitting: Emergency Medicine

## 2016-02-14 DIAGNOSIS — R531 Weakness: Secondary | ICD-10-CM | POA: Insufficient documentation

## 2016-02-14 DIAGNOSIS — R5383 Other fatigue: Secondary | ICD-10-CM | POA: Insufficient documentation

## 2016-02-14 DIAGNOSIS — Z791 Long term (current) use of non-steroidal anti-inflammatories (NSAID): Secondary | ICD-10-CM | POA: Insufficient documentation

## 2016-02-14 DIAGNOSIS — Z5181 Encounter for therapeutic drug level monitoring: Secondary | ICD-10-CM | POA: Insufficient documentation

## 2016-02-14 DIAGNOSIS — F17210 Nicotine dependence, cigarettes, uncomplicated: Secondary | ICD-10-CM | POA: Insufficient documentation

## 2016-02-14 LAB — URINALYSIS COMPLETE WITH MICROSCOPIC (ARMC ONLY)
Bacteria, UA: NONE SEEN
Bilirubin Urine: NEGATIVE
Glucose, UA: NEGATIVE mg/dL
Hgb urine dipstick: NEGATIVE
Ketones, ur: NEGATIVE mg/dL
Leukocytes, UA: NEGATIVE
Nitrite: NEGATIVE
Protein, ur: 30 mg/dL — AB
Specific Gravity, Urine: 1.026 (ref 1.005–1.030)
pH: 5 (ref 5.0–8.0)

## 2016-02-14 LAB — URINE DRUG SCREEN, QUALITATIVE (ARMC ONLY)
Amphetamines, Ur Screen: NOT DETECTED
Barbiturates, Ur Screen: NOT DETECTED
Benzodiazepine, Ur Scrn: POSITIVE — AB
Cannabinoid 50 Ng, Ur ~~LOC~~: NOT DETECTED
Cocaine Metabolite,Ur ~~LOC~~: NOT DETECTED
MDMA (Ecstasy)Ur Screen: NOT DETECTED
Methadone Scn, Ur: NOT DETECTED
Opiate, Ur Screen: POSITIVE — AB
Phencyclidine (PCP) Ur S: NOT DETECTED
Tricyclic, Ur Screen: NOT DETECTED

## 2016-02-14 LAB — BASIC METABOLIC PANEL
Anion gap: 5 (ref 5–15)
BUN: 8 mg/dL (ref 6–20)
CO2: 29 mmol/L (ref 22–32)
Calcium: 8.4 mg/dL — ABNORMAL LOW (ref 8.9–10.3)
Chloride: 104 mmol/L (ref 101–111)
Creatinine, Ser: 0.68 mg/dL (ref 0.61–1.24)
GFR calc Af Amer: >60 mL/min (ref >60)
GFR calc non Af Amer: >60 mL/min (ref >60)
Glucose, Bld: 133 mg/dL — ABNORMAL HIGH (ref 65–99)
Potassium: 3.5 mmol/L (ref 3.5–5.1)
Sodium: 138 mmol/L (ref 135–145)

## 2016-02-14 LAB — CBC
HCT: 40.1 % (ref 40.0–52.0)
Hemoglobin: 13.9 g/dL (ref 13.0–18.0)
MCH: 31.3 pg (ref 26.0–34.0)
MCHC: 34.5 g/dL (ref 32.0–36.0)
MCV: 90.7 fL (ref 80.0–100.0)
Platelets: 196 10*3/uL (ref 150–440)
RBC: 4.42 MIL/uL (ref 4.40–5.90)
RDW: 13.2 % (ref 11.5–14.5)
WBC: 7.7 10*3/uL (ref 3.8–10.6)

## 2016-02-14 NOTE — ED Triage Notes (Signed)
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 Pt also c/o dizziness. vss

## 2016-02-14 NOTE — ED Provider Notes (Signed)
[facility] Emergency Department Provider Note   ____________________________________________    I have reviewed the triage vital signs and the nursing notes.   HISTORY  Chief Complaint Weakness     HPI John Riddle is a 35 y.o. male who presents with complaints of fatigue and weakness. Patient reports he donated plasma yesterday but they were unable to obtain IV to transfuse blood. Since that time he has been feeling weak and fatigued. He denies fevers chills. No cough. No nausea vomiting. No dysuria. No rash. He denies drug abuse. No alcohol.   Past Medical History:  Diagnosis Date  . MRSA cellulitis     There are no active problems to display for this patient.   Past Surgical History:  Procedure Laterality Date  . HAND SURGERY Left    thumb    Prior to Admission medications   Medication Sig Start Date End Date Taking? Authorizing Provider  acetaminophen-codeine (TYLENOL #3) 300-30 MG tablet Take 1 tablet by mouth every 6 (six) hours as needed for moderate pain. 01/26/16   Jenise V Bacon Menshew, PA-C  doxycycline (VIBRA-TABS) 100 MG tablet Take 1 tablet (100 mg total) by mouth 2 (two) times daily. 01/26/16   Jenise V Bacon Menshew, PA-C  fluconazole (DIFLUCAN) 150 MG tablet Take 2 tablets (300 mg total) by mouth once a week. 11/30/15   Charmayne Sheerharles M Beers, PA-C  hydrOXYzine (ATARAX/VISTARIL) 50 MG tablet Take 1 tablet (50 mg total) by mouth 3 (three) times daily as needed for itching. 01/03/16   Joni Reiningonald K Smith, PA-C  oxyCODONE-acetaminophen (ROXICET) 5-325 MG tablet Take 1-2 tablets by mouth every 4 (four) hours as needed for severe pain. 11/30/15   Charmayne Sheerharles M Beers, PA-C  permethrin (ELIMITE) 5 % cream Apply 1 application topically once. 01/03/16   Joni Reiningonald K Smith, PA-C  selenium sulfide (SELSUN) 2.5 % shampoo Apply 1 application topically daily as needed for irritation. 11/30/15   Charmayne Sheerharles M Beers, PA-C  traMADol (ULTRAM) 50 MG tablet Take 1 tablet (50 mg  total) by mouth every 6 (six) hours as needed for moderate pain. 01/03/16   Joni Reiningonald K Smith, PA-C     Allergies Bactrim [sulfamethoxazole-trimethoprim]; Codeine; Hydrocodone; and Clindamycin/lincomycin  History reviewed. No pertinent family history.  Social History Social History  Substance Use Topics  . Smoking status: Current Every Day Smoker    Packs/day: 2.00    Types: Cigarettes  . Smokeless tobacco: Never Used  . Alcohol use No    Review of Systems  Constitutional: No fever/chills Eyes: No visual changes.  ENT: No sore throat. Cardiovascular: Denies chest pain. Respiratory: Denies shortness of breath. Gastrointestinal: No abdominal pain.  No nausea, no vomiting.   Genitourinary: Negative for dysuria. Musculoskeletal: Negative for back pain. Skin: Negative for rash. Neurological: Negative for headaches  10-point ROS otherwise negative.  ____________________________________________   PHYSICAL EXAM:  VITAL SIGNS: ED Triage Vitals  Enc Vitals Group     BP 02/14/16 1941 105/69     Pulse Rate 02/14/16 1941 84     Resp 02/14/16 1941 18     Temp 02/14/16 1941 98.4 F (36.9 C)     Temp src --      SpO2 02/14/16 1941 97 %     Weight 02/14/16 1942 180 lb (81.6 kg)     Height 02/14/16 1942 6' (1.829 m)     Head Circumference --      Peak Flow --      Pain Score 02/14/16 1942  7     Pain Loc --      Pain Edu? --      Excl. in GC? --     Constitutional: Alert and oriented. No acute distress.  Eyes: Conjunctivae are normal.  Head: Atraumatic. Nose: No congestion/rhinnorhea. Mouth/Throat: Mucous membranes are moist.   Neck:  Painless ROM Cardiovascular: Normal rate, regular rhythm. Grossly normal heart sounds.  Good peripheral circulation. Respiratory: Normal respiratory effort.  No retractions. Lungs CTAB. Gastrointestinal: Soft and nontender. No distention.  No CVA tenderness. Genitourinary: deferred Musculoskeletal: No lower extremity tenderness nor edema.   Warm and well perfused Neurologic:  Speech impediment noted . No gross focal neurologic deficits are appreciated.  Skin:  Skin is warm, dry and intact. No rash noted. Psychiatric: Mood and affect are normal. Speech and behavior are normal.  ____________________________________________   LABS (all labs ordered are listed, but only abnormal results are displayed)  Labs Reviewed  BASIC METABOLIC PANEL - Abnormal; Notable for the following:       Result Value   Glucose, Bld 133 (*)    Calcium 8.4 (*)    All other components within normal limits  URINALYSIS COMPLETEWITH MICROSCOPIC ([facility] ONLY) - Abnormal; Notable for the following:    Color, Urine YELLOW (*)    APPearance CLEAR (*)    Protein, ur 30 (*)    Squamous Epithelial / LPF 0-5 (*)    All other components within normal limits  URINE DRUG SCREEN, QUALITATIVE ([facility] ONLY) - Abnormal; Notable for the following:    Opiate, Ur Screen POSITIVE (*)    Benzodiazepine, Ur Scrn POSITIVE (*)    All other components within normal limits  CBC  CBG MONITORING, ED   ____________________________________________  EKG  ED ECG REPORT I, John Riddle, John Riddle, the attending physician, personally viewed and interpreted this ECG.  Date: 02/14/2016 EKG Time: 7:37 PM Rate: 82 Rhythm: normal sinus rhythm QRS Axis: normal Intervals: normal ST/T Wave abnormalities: Nonspecific changes Conduction Disturbances: none   ____________________________________________  RADIOLOGY  None ____________________________________________   PROCEDURES  Procedure(s) performed: No    Critical Care performed: No ____________________________________________   INITIAL IMPRESSION / ASSESSMENT AND PLAN / ED COURSE  Pertinent labs & imaging results that were available during my care of the patient were reviewed by me and considered in my medical decision making (see chart for details).  Patient well-appearing and in no acute distress. He is somewhat  fatigued but his vital signs are normal. Lab work is unremarkable. No chest pain or shortness of breath. EKG is reassuring.  Clinical Course  Value Comment By Time  Glucose: NEGATIVE (Reviewed) John Everyobert Jamesyn Lindell, MD 08/20 2013  Patient does have opioids and benzos in his urinary drug screen, I asked him about this and he said he took a Xanax to help him sleep last night. We will observe him in the emergency department ----------------------------------------- 10:43 PM on 02/14/2016 -----------------------------------------   Patient observed in the emergency department for several hours. He is awake and alert, he was kept on pulse oximetry and had no episodes of desaturations while resting. Feel he is safe for discharge. ____________________________________________   FINAL CLINICAL IMPRESSION(S) / ED DIAGNOSES  Final diagnoses:  Generalized weakness      NEW MEDICATIONS STARTED DURING THIS VISIT:  New Prescriptions   No medications on file     Note:  This document was prepared using Dragon voice recognition software and may include unintentional dictation errors.    John Everyobert Shamariah Shewmake, MD 02/14/16 [phone]

## 2016-02-14 NOTE — ED Triage Notes (Addendum)
Pt states went to donate plasma yesterday 7pm and they were unable to gain assess after 4 tries and after he left is when he began to have the weakness, dizziness

## 2016-02-21 ENCOUNTER — Emergency Department
Admission: EM | Admit: 2016-02-21 | Discharge: 2016-02-21 | Disposition: A | Discharge status: Home / Self Care | Payer: No Typology Code available for payment source | Attending: Emergency Medicine | Admitting: Emergency Medicine

## 2016-02-21 ENCOUNTER — Encounter: Payer: Self-pay | Admitting: Emergency Medicine

## 2016-02-21 ENCOUNTER — Emergency Department: Payer: No Typology Code available for payment source

## 2016-02-21 DIAGNOSIS — Y92410 Unspecified street and highway as the place of occurrence of the external cause: Secondary | ICD-10-CM | POA: Diagnosis not present

## 2016-02-21 DIAGNOSIS — S46812A Strain of other muscles, fascia and tendons at shoulder and upper arm level, left arm, initial encounter: Secondary | ICD-10-CM | POA: Insufficient documentation

## 2016-02-21 DIAGNOSIS — Y999 Unspecified external cause status: Secondary | ICD-10-CM | POA: Diagnosis not present

## 2016-02-21 DIAGNOSIS — S4992XA Unspecified injury of left shoulder and upper arm, initial encounter: Secondary | ICD-10-CM | POA: Diagnosis present

## 2016-02-21 DIAGNOSIS — F17210 Nicotine dependence, cigarettes, uncomplicated: Secondary | ICD-10-CM | POA: Diagnosis not present

## 2016-02-21 DIAGNOSIS — Y939 Activity, unspecified: Secondary | ICD-10-CM | POA: Diagnosis not present

## 2016-02-21 MED ORDER — NAPROXEN 500 MG PO TABS: 500.0000 mg | ORAL_TABLET | Freq: Two times a day (BID) | ORAL | 0 refills | Status: DC

## 2016-02-21 MED ORDER — KETOROLAC TROMETHAMINE 30 MG/ML IJ SOLN
30.0000 mg | Freq: Once | INTRAMUSCULAR | Status: AC
  Administered 2016-02-21: 30 mg via INTRAMUSCULAR
  Filled 2016-02-21: qty 1
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
## 2017-01-21 DIAGNOSIS — R2231 Localized swelling, mass and lump, right upper limb: Secondary | ICD-10-CM | POA: Insufficient documentation

## 2017-01-21 DIAGNOSIS — Z5321 Procedure and treatment not carried out due to patient leaving prior to being seen by health care provider: Secondary | ICD-10-CM | POA: Insufficient documentation

## 2017-01-21 NOTE — ED Triage Notes (Signed)
Patient to stat desk asking about wait time. Patient updated on wait time. Patient in NAD at this time.
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
## 2017-01-22 ENCOUNTER — Emergency Department
Admission: EM | Admit: 2017-01-22 | Discharge: 2017-01-22 | Disposition: A | Discharge status: Home / Self Care | Payer: Self-pay | Attending: Emergency Medicine | Admitting: Emergency Medicine

## 2017-02-22 ENCOUNTER — Emergency Department
Admission: EM | Admit: 2017-02-22 | Discharge: 2017-02-22 | Disposition: A | Discharge status: Home / Self Care | Payer: Self-pay | Attending: Emergency Medicine | Admitting: Emergency Medicine

## 2017-02-22 ENCOUNTER — Encounter: Payer: Self-pay | Admitting: *Deleted

## 2017-02-22 DIAGNOSIS — F17210 Nicotine dependence, cigarettes, uncomplicated: Secondary | ICD-10-CM | POA: Insufficient documentation

## 2017-02-22 DIAGNOSIS — Z79899 Other long term (current) drug therapy: Secondary | ICD-10-CM | POA: Insufficient documentation

## 2017-02-22 DIAGNOSIS — L03317 Cellulitis of buttock: Secondary | ICD-10-CM | POA: Insufficient documentation

## 2017-02-22 MED ORDER — DOXYCYCLINE HYCLATE 100 MG PO TABS: 100.0000 mg | ORAL_TABLET | Freq: Two times a day (BID) | ORAL | 0 refills | Status: DC

## 2017-02-22 MED ORDER — DOXYCYCLINE HYCLATE 100 MG PO TABS
100.0000 mg | ORAL_TABLET | Freq: Once | ORAL | Status: AC
  Administered 2017-02-22: 100 mg via ORAL
  Filled 2017-02-22: qty 1

## 2017-02-22 NOTE — ED Provider Notes (Signed)
[facility] Emergency Department Provider Note ____________________________________________  Time seen: 2135  I have reviewed the triage vital signs and the nursing notes.  HISTORY  Chief Complaint  Abscess  HPI John Riddle is a 36 y.o. male presents to the ED for evaluation of a small area of redness and tenderness to the lateral left hip. Patient instructed to 10 for 3 days and he had breakfast today. He got no drainage of purulent return. He denies any fevers, chills, sweats. He does have history of recurrent skin infections.  Past Medical History:  Diagnosis Date  . MRSA cellulitis    There are no active problems to display for this patient.  Past Surgical History:  Procedure Laterality Date  . HAND SURGERY Left    thumb    Prior to Admission medications   Medication Sig Start Date End Date Taking? Authorizing Provider  acetaminophen-codeine (TYLENOL #3) 300-30 MG tablet Take 1 tablet by mouth every 6 (six) hours as needed for moderate pain. 01/26/16   Kyal Arts, Charlesetta Ivory, PA-C  doxycycline (VIBRA-TABS) 100 MG tablet Take 1 tablet (100 mg total) by mouth 2 (two) times daily. 02/22/17   Phillis Thackeray, Charlesetta Ivory, PA-C  fluconazole (DIFLUCAN) 150 MG tablet Take 2 tablets (300 mg total) by mouth once a week. 11/30/15   Beers, Charmayne Sheer, PA-C  hydrOXYzine (ATARAX/VISTARIL) 50 MG tablet Take 1 tablet (50 mg total) by mouth 3 (three) times daily as needed for itching. 01/03/16   Joni Reining, PA-C  naproxen (NAPROSYN) 500 MG tablet Take 1 tablet (500 mg total) by mouth 2 (two) times daily with a meal. 02/21/16   Hagler, Jami L, PA-C  oxyCODONE-acetaminophen (ROXICET) 5-325 MG tablet Take 1-2 tablets by mouth every 4 (four) hours as needed for severe pain. 11/30/15   Beers, Charmayne Sheer, PA-C  permethrin (ELIMITE) 5 % cream Apply 1 application topically once. 01/03/16   Joni Reining, PA-C  selenium sulfide (SELSUN) 2.5 % shampoo Apply 1 application topically daily as  needed for irritation. 11/30/15   Beers, Charmayne Sheer, PA-C  traMADol (ULTRAM) 50 MG tablet Take 1 tablet (50 mg total) by mouth every 6 (six) hours as needed for moderate pain. 01/03/16   Joni Reining, PA-C    Allergies Bactrim [sulfamethoxazole-trimethoprim]; Codeine; Hydrocodone; and Clindamycin/lincomycin  No family history on file.  Social History Social History  Substance Use Topics  . Smoking status: Current Every Day Smoker    Packs/day: 2.00    Types: Cigarettes  . Smokeless tobacco: Never Used  . Alcohol use No    Review of Systems  Constitutional: Negative for fever. Cardiovascular: Negative for chest pain. Respiratory: Negative for shortness of breath. Musculoskeletal: Negative for back pain. Skin: Negative for rash. Skin infection as above ____________________________________________  PHYSICAL EXAM:  VITAL SIGNS: ED Triage Vitals  Enc Vitals Group     BP 02/22/17 2056 121/74     Pulse Rate 02/22/17 2056 73     Resp 02/22/17 2056 18     Temp 02/22/17 2056 98.6 F (37 C)     Temp Source 02/22/17 2056 Oral     SpO2 02/22/17 2056 99 %     Weight 02/22/17 2054 200 lb (90.7 kg)     Height 02/22/17 2054 6' (1.829 m)     Head Circumference --      Peak Flow --      Pain Score 02/22/17 2054 8     Pain Loc --  Pain Edu? --      Excl. in GC? --     Constitutional: Alert and oriented. Well appearing and in no distress. Head: Normocephalic and atraumatic. Respiratory: Normal respiratory effort.  Musculoskeletal: Nontender with normal range of motion in all extremities.  Neurologic:  Normal gait without ataxia. Normal speech and language. No gross focal neurologic deficits are appreciated. Skin:  Skin is warm, dry and intact. No rash noted. Left lateral thigh with well circumscribed, erythematous area of induration without fluctuance. There is no pointing, central punctum, or spontaneous drainage. There appears to be an overlying scab consistent with the  patient's attempt to incise and drain the area prior to arrival. ____________________________________________  PROCEDURES  Doxycycline 100 mg PO ____________________________________________  INITIAL IMPRESSION / ASSESSMENT AND PLAN / ED COURSE  Patient was ED evaluation of a nonpurulent cellulitis to the left lateral buttocks. Patient is started on doxycycline, and is given wound care instruction. [facility] or return to the ED as needed. He is encouraged to apply warm compresses to promote healing.  ____________________________________________  FINAL CLINICAL IMPRESSION(S) / ED DIAGNOSES  Final diagnoses:  Cellulitis of buttock      Kaydee Magel, Charlesetta IvoryJenise V Bacon, PA-C 02/22/17 2233    Merrily Brittleifenbark, Neil, MD 02/22/17 2246

## 2017-02-22 NOTE — Discharge Instructions (Signed)
You are being treated for a skin infection. Take the antibiotic as directed. [facility]. Apply warm compressed to promote healing. Return to the ED as needed.

## 2017-02-22 NOTE — ED Triage Notes (Signed)
Pt has abscess to left hip area.  Pt stuck a knife in it today.  No drainage. Area red and tender.  Pt alert.

## 2017-02-22 NOTE — ED Notes (Signed)
Pt states that he has an abcess on  left buttocks for the past 2 days. Gradually getting worse. Family at bedside.

## 2017-03-19 ENCOUNTER — Encounter: Payer: Self-pay | Admitting: Emergency Medicine

## 2017-03-19 ENCOUNTER — Emergency Department: Payer: Self-pay

## 2017-03-19 ENCOUNTER — Emergency Department
Admission: EM | Admit: 2017-03-19 | Discharge: 2017-03-19 | Disposition: A | Discharge status: Home / Self Care | Payer: Self-pay | Attending: Emergency Medicine | Admitting: Emergency Medicine

## 2017-03-19 DIAGNOSIS — Y93H2 Activity, gardening and landscaping: Secondary | ICD-10-CM | POA: Insufficient documentation

## 2017-03-19 DIAGNOSIS — Y9289 Other specified places as the place of occurrence of the external cause: Secondary | ICD-10-CM | POA: Insufficient documentation

## 2017-03-19 DIAGNOSIS — S9031XA Contusion of right foot, initial encounter: Secondary | ICD-10-CM | POA: Insufficient documentation

## 2017-03-19 DIAGNOSIS — F17210 Nicotine dependence, cigarettes, uncomplicated: Secondary | ICD-10-CM | POA: Insufficient documentation

## 2017-03-19 DIAGNOSIS — W230XXA Caught, crushed, jammed, or pinched between moving objects, initial encounter: Secondary | ICD-10-CM | POA: Insufficient documentation

## 2017-03-19 DIAGNOSIS — Y990 Civilian activity done for income or pay: Secondary | ICD-10-CM | POA: Insufficient documentation

## 2017-03-19 MED ORDER — MELOXICAM 15 MG PO TABS: 15.0000 mg | ORAL_TABLET | Freq: Every day | ORAL | 1 refills | Status: AC

## 2017-03-19 MED ORDER — MELOXICAM 7.5 MG PO TABS
15.0000 mg | ORAL_TABLET | Freq: Every day | ORAL | Status: DC
Start: 2017-03-19 — End: 2017-03-20
  Administered 2017-03-19: 15 mg via ORAL
  Filled 2017-03-19: qty 2

## 2017-03-19 NOTE — ED Triage Notes (Signed)
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 Pt ambulatory.

## 2017-03-19 NOTE — ED Provider Notes (Signed)
[facility] Emergency Department Provider Note  ____________________________________________  Time seen: Approximately 9:12 PM  I have reviewed the triage vital signs and the nursing notes.   HISTORY  Chief Complaint Foot Pain    HPI John Riddle is a 36 y.o. male presenting emergency Department with 10 out of 10 right foot pain after patient reports that a limb fell on his right foot tonight. Patient cuts trees for a living. Patient did not hit his head or lose consciousness. Patient was able to ambulate after the incident. He denies neck pain, back pain, weakness or radiculopathy. Patient has noticed ecchymosis. No abrasions or lacerations. No alleviating measures have been attempted.   Past Medical History:  Diagnosis Date  . MRSA cellulitis     There are no active problems to display for this patient.   Past Surgical History:  Procedure Laterality Date  . HAND SURGERY Left    thumb    Prior to Admission medications   Medication Sig Start Date End Date Taking? Authorizing Provider  acetaminophen-codeine (TYLENOL #3) 300-30 MG tablet Take 1 tablet by mouth every 6 (six) hours as needed for moderate pain. 01/26/16   Menshew, Charlesetta Ivory, PA-C  doxycycline (VIBRA-TABS) 100 MG tablet Take 1 tablet (100 mg total) by mouth 2 (two) times daily. 02/22/17   Menshew, Charlesetta Ivory, PA-C  fluconazole (DIFLUCAN) 150 MG tablet Take 2 tablets (300 mg total) by mouth once a week. 11/30/15   Beers, Charmayne Sheer, PA-C  hydrOXYzine (ATARAX/VISTARIL) 50 MG tablet Take 1 tablet (50 mg total) by mouth 3 (three) times daily as needed for itching. 01/03/16   Joni Reining, PA-C  meloxicam (MOBIC) 15 MG tablet Take 1 tablet (15 mg total) by mouth daily. 03/19/17 03/26/17  Orvil Feil, PA-C  naproxen (NAPROSYN) 500 MG tablet Take 1 tablet (500 mg total) by mouth 2 (two) times daily with a meal. 02/21/16   Hagler, Jami L, PA-C  oxyCODONE-acetaminophen (ROXICET) 5-325 MG tablet  Take 1-2 tablets by mouth every 4 (four) hours as needed for severe pain. 11/30/15   Beers, Charmayne Sheer, PA-C  permethrin (ELIMITE) 5 % cream Apply 1 application topically once. 01/03/16   Joni Reining, PA-C  selenium sulfide (SELSUN) 2.5 % shampoo Apply 1 application topically daily as needed for irritation. 11/30/15   Beers, Charmayne Sheer, PA-C  traMADol (ULTRAM) 50 MG tablet Take 1 tablet (50 mg total) by mouth every 6 (six) hours as needed for moderate pain. 01/03/16   Joni Reining, PA-C    Allergies Bactrim [sulfamethoxazole-trimethoprim]; Codeine; Hydrocodone; and Clindamycin/lincomycin  No family history on file.  Social History Social History  Substance Use Topics  . Smoking status: Current Every Day Smoker    Packs/day: 2.00    Types: Cigarettes  . Smokeless tobacco: Never Used  . Alcohol use No     Review of Systems  Constitutional: No fever/chills Eyes: No visual changes. No discharge Cardiovascular: no chest pain. Respiratory: no cough. No SOB. Musculoskeletal: Patient has right foot pain.  Skin: Positive for ecchymosis. Neurological: Negative for headaches, focal weakness or numbness.   ____________________________________________   PHYSICAL EXAM:  VITAL SIGNS: ED Triage Vitals  Enc Vitals Group     BP 03/19/17 1837 125/85     Pulse Rate 03/19/17 1837 71     Resp 03/19/17 1837 17     Temp 03/19/17 1837 98.4 F (36.9 C)     Temp Source 03/19/17 1837 Oral  SpO2 03/19/17 1837 98 %     Weight 03/19/17 1835 200 lb (90.7 kg)     Height 03/19/17 1835 6' (1.829 m)     Head Circumference --      Peak Flow --      Pain Score 03/19/17 1834 8     Pain Loc --      Pain Edu? --      Excl. in GC? --      Constitutional: Alert and oriented. Well appearing and in no acute distress. Eyes: Conjunctivae are normal. PERRL. EOMI. Head: Atraumatic. Cardiovascular: Normal rate, regular rhythm. Normal S1 and S2.  Good peripheral circulation. Respiratory: Normal  respiratory effort without tachypnea or retractions. Lungs CTAB. Good air entry to the bases with no decreased or absent breath sounds. Musculoskeletal: Patient performs full range of motion at the right knee and right ankle. Patient is able to move all 5 right toes. Patient has ecchymosis visualized along the first metatarsal and first digit. Palpable dorsalis pedis pulse, right. Neurologic: Normal speech and language. No gross focal neurologic deficits are appreciated.  Skin:  Skin is warm, dry and intact. No rash noted. Psychiatric: Mood and affect are normal. Speech and behavior are normal. Patient exhibits appropriate insight and judgement.   ____________________________________________   LABS (all labs ordered are listed, but only abnormal results are displayed)  Labs Reviewed - No data to display ____________________________________________  EKG   ____________________________________________  RADIOLOGY Geraldo Pitter, personally viewed and evaluated these images (plain radiographs) as part of my medical decision making, as well as reviewing the written report by the radiologist.  Dg Foot Complete Right  Result Date: 03/19/2017 CLINICAL DATA:  Wood fell on foot, pain. EXAM: RIGHT FOOT COMPLETE - 3+ VIEW COMPARISON:  RIGHT foot radiograph July 31, 2012 FINDINGS: There is no evidence of fracture or dislocation. Complete interval healing of old first distal phalanx fracture. Small plantar calcaneal spur. There is no evidence of arthropathy or other focal bone abnormality. Small osteophyte distal anterior tibia. Soft tissues are unremarkable. IMPRESSION: No acute osseous process. Electronically Signed   By: Awilda Metro M.D.   On: 03/19/2017 20:14    ____________________________________________    PROCEDURES  Procedure(s) performed:    Procedures    Medications  meloxicam (MOBIC) tablet 15 mg (not administered)      ____________________________________________   INITIAL IMPRESSION / ASSESSMENT AND PLAN / ED COURSE  Pertinent labs & imaging results that were available during my care of the patient were reviewed by me and considered in my medical decision making (see chart for details).  Review of the [place] CSRS was performed in accordance of the [facility] prior to dispensing any controlled drugs.     Assessment and Plan:  Right foot contusion Patient presents to the emergency department with right foot pain after a limb fell on foot this evening. X-ray examination reveals no acute fractures or bony abnormalities. Patient declined a postop shoe in the emergency department. Patient was discharged with meloxicam. A work note was provided as well as a referral to podiatry. All patients questions were answered.   ____________________________________________  FINAL CLINICAL IMPRESSION(S) / ED DIAGNOSES  Final diagnoses:  Contusion of right foot, initial encounter      NEW MEDICATIONS STARTED DURING THIS VISIT:  New Prescriptions   MELOXICAM (MOBIC) 15 MG TABLET    Take 1 tablet (15 mg total) by mouth daily.        This chart was dictated using voice recognition  software/Dragon. Despite best efforts to proofread, errors can occur which can change the meaning. Any change was purely unintentional.    Gasper Lloyd 03/19/17 2320    Sharman Cheek, MD 03/23/17 [phone]

## 2017-03-19 NOTE — ED Notes (Signed)
Pt reports pain in right foot after injury a few hours ago - pt reports that a piece of wood fell on his foot

## 2018-03-12 ENCOUNTER — Other Ambulatory Visit: Payer: Self-pay

## 2018-03-12 ENCOUNTER — Emergency Department: Payer: Self-pay

## 2018-03-12 ENCOUNTER — Emergency Department
Admission: EM | Admit: 2018-03-12 | Discharge: 2018-03-12 | Disposition: A | Discharge status: Home / Self Care | Payer: Self-pay | Attending: Emergency Medicine | Admitting: Emergency Medicine

## 2018-03-12 ENCOUNTER — Encounter: Payer: Self-pay | Admitting: Emergency Medicine

## 2018-03-12 DIAGNOSIS — W11XXXA Fall on and from ladder, initial encounter: Secondary | ICD-10-CM | POA: Insufficient documentation

## 2018-03-12 DIAGNOSIS — Y9389 Activity, other specified: Secondary | ICD-10-CM | POA: Insufficient documentation

## 2018-03-12 DIAGNOSIS — S50811A Abrasion of right forearm, initial encounter: Secondary | ICD-10-CM | POA: Insufficient documentation

## 2018-03-12 DIAGNOSIS — S32009A Unspecified fracture of unspecified lumbar vertebra, initial encounter for closed fracture: Secondary | ICD-10-CM | POA: Insufficient documentation

## 2018-03-12 DIAGNOSIS — Y929 Unspecified place or not applicable: Secondary | ICD-10-CM | POA: Insufficient documentation

## 2018-03-12 DIAGNOSIS — F17210 Nicotine dependence, cigarettes, uncomplicated: Secondary | ICD-10-CM | POA: Insufficient documentation

## 2018-03-12 DIAGNOSIS — W19XXXA Unspecified fall, initial encounter: Secondary | ICD-10-CM

## 2018-03-12 DIAGNOSIS — S20211A Contusion of right front wall of thorax, initial encounter: Secondary | ICD-10-CM | POA: Insufficient documentation

## 2018-03-12 DIAGNOSIS — Z79899 Other long term (current) drug therapy: Secondary | ICD-10-CM | POA: Insufficient documentation

## 2018-03-12 DIAGNOSIS — Y998 Other external cause status: Secondary | ICD-10-CM | POA: Insufficient documentation

## 2018-03-12 DIAGNOSIS — S40811A Abrasion of right upper arm, initial encounter: Secondary | ICD-10-CM

## 2018-03-12 LAB — COMPREHENSIVE METABOLIC PANEL
ALT: 29 U/L (ref 0–44)
AST: 35 U/L (ref 15–41)
Albumin: 4.2 g/dL (ref 3.5–5.0)
Alkaline Phosphatase: 47 U/L (ref 38–126)
Anion gap: 8 (ref 5–15)
BUN: 13 mg/dL (ref 6–20)
CO2: 25 mmol/L (ref 22–32)
Calcium: 9.2 mg/dL (ref 8.9–10.3)
Chloride: 100 mmol/L (ref 98–111)
Creatinine, Ser: 0.83 mg/dL (ref 0.61–1.24)
GFR calc Af Amer: >60 mL/min (ref >60)
GFR calc non Af Amer: >60 mL/min (ref >60)
Glucose, Bld: 112 mg/dL — ABNORMAL HIGH (ref 70–99)
Potassium: 3.3 mmol/L — ABNORMAL LOW (ref 3.5–5.1)
Sodium: 133 mmol/L — ABNORMAL LOW (ref 135–145)
Total Bilirubin: 0.7 mg/dL (ref 0.3–1.2)
Total Protein: 7.3 g/dL (ref 6.5–8.1)

## 2018-03-12 LAB — CBC WITH DIFFERENTIAL/PLATELET
Basophils Absolute: 0 10*3/uL (ref 0–0.1)
Basophils Relative: 1 %
Eosinophils Absolute: 0.1 10*3/uL (ref 0–0.7)
Eosinophils Relative: 1 %
HCT: 39.9 % — ABNORMAL LOW (ref 40.0–52.0)
Hemoglobin: 14.1 g/dL (ref 13.0–18.0)
Lymphocytes Relative: 20 %
Lymphs Abs: 1.5 10*3/uL (ref 1.0–3.6)
MCH: 32.6 pg (ref 26.0–34.0)
MCHC: 35.2 g/dL (ref 32.0–36.0)
MCV: 92.6 fL (ref 80.0–100.0)
Monocytes Absolute: 0.4 10*3/uL (ref 0.2–1.0)
Monocytes Relative: 6 %
Neutro Abs: 5.5 10*3/uL (ref 1.4–6.5)
Neutrophils Relative %: 72 %
Platelets: 247 10*3/uL (ref 150–440)
RBC: 4.31 MIL/uL — ABNORMAL LOW (ref 4.40–5.90)
RDW: 12.5 % (ref 11.5–14.5)
WBC: 7.6 10*3/uL (ref 3.8–10.6)

## 2018-03-12 LAB — LIPASE, BLOOD: Lipase: 20 U/L (ref 11–51)

## 2018-03-12 MED ORDER — NAPROXEN 500 MG PO TABS: 500.0000 mg | ORAL_TABLET | Freq: Two times a day (BID) | ORAL | 0 refills | Status: DC

## 2018-03-12 MED ORDER — OXYCODONE-ACETAMINOPHEN 5-325 MG PO TABS
2.0000 | ORAL_TABLET | Freq: Once | ORAL | Status: AC
  Administered 2018-03-12: 2 via ORAL
  Filled 2018-03-12: qty 2

## 2018-03-12 MED ORDER — OXYCODONE-ACETAMINOPHEN 5-325 MG PO TABS: 1.0000 | ORAL_TABLET | Freq: Four times a day (QID) | ORAL | 0 refills | Status: AC | PRN

## 2018-03-12 MED ORDER — IOPAMIDOL (ISOVUE-300) INJECTION 61%
100.0000 mL | Freq: Once | INTRAVENOUS | Status: AC | PRN
  Administered 2018-03-12: 100 mL via INTRAVENOUS

## 2018-03-12 MED ORDER — OXYCODONE-ACETAMINOPHEN 5-325 MG PO TABS: 1.0000 | ORAL_TABLET | Freq: Four times a day (QID) | ORAL | 0 refills | Status: DC | PRN

## 2018-03-12 MED ORDER — HYDROMORPHONE HCL 1 MG/ML IJ SOLN
1.0000 mg | Freq: Once | INTRAMUSCULAR | Status: AC
Start: 2018-03-12 — End: 2018-03-12
  Administered 2018-03-12: 1 mg via INTRAVENOUS
  Filled 2018-03-12: qty 1

## 2018-03-12 NOTE — ED Triage Notes (Signed)
States 20 ft fall off ladder, R arm and low back pain.

## 2018-03-12 NOTE — ED Provider Notes (Signed)
[facility] Emergency Department Provider Note  ____________________________________________  Time seen: Approximately 4:06 PM  I have reviewed the triage vital signs and the nursing notes.   HISTORY  Chief Complaint Fall    HPI John Riddle is a 37 y.o. male with no significant past medical history who complains of pain in the right arm diffusely as well as in the lower back after a fall 20 feet off of a ladder.  No head injury or loss of consciousness.  Denies neck pain.  Pain is sudden onset, severe, constant, nonradiating, worse with movement, no alleviating factors.      Past Medical History:  Diagnosis Date  . MRSA cellulitis      There are no active problems to display for this patient.    Past Surgical History:  Procedure Laterality Date  . HAND SURGERY Left    thumb     Prior to Admission medications   Medication Sig Start Date End Date Taking? Authorizing Provider  acetaminophen-codeine (TYLENOL #3) 300-30 MG tablet Take 1 tablet by mouth every 6 (six) hours as needed for moderate pain. 01/26/16   Menshew, Charlesetta Ivory, PA-C  doxycycline (VIBRA-TABS) 100 MG tablet Take 1 tablet (100 mg total) by mouth 2 (two) times daily. 02/22/17   Menshew, Charlesetta Ivory, PA-C  fluconazole (DIFLUCAN) 150 MG tablet Take 2 tablets (300 mg total) by mouth once a week. 11/30/15   Beers, Charmayne Sheer, PA-C  hydrOXYzine (ATARAX/VISTARIL) 50 MG tablet Take 1 tablet (50 mg total) by mouth 3 (three) times daily as needed for itching. 01/03/16   Joni Reining, PA-C  naproxen (NAPROSYN) 500 MG tablet Take 1 tablet (500 mg total) by mouth 2 (two) times daily with a meal. 03/12/18   Sharman Cheek, MD  oxyCODONE-acetaminophen (PERCOCET) 5-325 MG tablet Take 1 tablet by mouth every 6 (six) hours as needed for severe pain. 03/12/18 03/12/19  Sharman Cheek, MD  permethrin (ELIMITE) 5 % cream Apply 1 application topically once. 01/03/16   Joni Reining, PA-C  selenium  sulfide (SELSUN) 2.5 % shampoo Apply 1 application topically daily as needed for irritation. 11/30/15   Beers, Charmayne Sheer, PA-C  traMADol (ULTRAM) 50 MG tablet Take 1 tablet (50 mg total) by mouth every 6 (six) hours as needed for moderate pain. 01/03/16   Joni Reining, PA-C     Allergies Bactrim [sulfamethoxazole-trimethoprim]; Codeine; Hydrocodone; and Clindamycin/lincomycin   No family history on file.  Social History Social History   Tobacco Use  . Smoking status: Current Every Day Smoker    Packs/day: 2.00    Types: Cigarettes  . Smokeless tobacco: Never Used  Substance Use Topics  . Alcohol use: No  . Drug use: No    Review of Systems  Constitutional:   No fever or chills.  ENT:   No sore throat. No rhinorrhea. Cardiovascular:   No chest pain or syncope. Respiratory:   No dyspnea or cough. Gastrointestinal:   Negative for abdominal pain, vomiting and diarrhea.  Musculoskeletal:   Right arm pain, low back pain as above All other systems reviewed and are negative except as documented above in ROS and HPI.  ____________________________________________   PHYSICAL EXAM:  VITAL SIGNS: ED Triage Vitals  Enc Vitals Group     BP 03/12/18 1231 (!) 146/94     Pulse Rate 03/12/18 1231 80     Resp 03/12/18 1231 20     Temp 03/12/18 1231 98.4 F (36.9 C)  Temp Source 03/12/18 1231 Oral     SpO2 03/12/18 1231 99 %     Weight 03/12/18 1233 190 lb (86.2 kg)     Height 03/12/18 1233 6\' 1"  (1.854 m)     Head Circumference --      Peak Flow --      Pain Score 03/12/18 1233 10     Pain Loc --      Pain Edu? --      Excl. in GC? --     Vital signs reviewed, nursing assessments reviewed.   Constitutional:   Alert and oriented.  Very uncomfortable but not in distress Eyes:   Conjunctivae are normal. EOMI. PERRL. ENT      Head:   Normocephalic and atraumatic.      Nose:   No congestion/rhinnorhea.       Mouth/Throat:   MMM, no pharyngeal erythema. No peritonsillar  mass.  Poor dentition      Neck:   No meningismus. Full ROM. Hematological/Lymphatic/Immunilogical:   No cervical lymphadenopathy. Cardiovascular:   RRR. Symmetric bilateral radial and DP pulses.  No murmurs. Cap refill less than 2 seconds. Respiratory:   Normal respiratory effort without tachypnea/retractions. Breath sounds are clear and equal bilaterally. No wheezes/rales/rhonchi. Gastrointestinal:   Soft and nontender. Non distended. There is no CVA tenderness.  No rebound, rigidity, or guarding.  Musculoskeletal:   Normal range of motion in all extremities. No joint effusions.  No lower extremity tenderness.  No edema.  Pelvis stable.  Chest wall stable.  Tenderness of the right chest wall in the anterolateral chest.  Swelling of the right arm around the elbow and forearm.  No midline spinal tenderness.  There is tenderness in the right lower back with muscle spasm. Neurologic:   Normal speech and language.  Motor grossly intact. No acute focal neurologic deficits are appreciated.  Skin:    Skin is warm, dry with abrasions of the right forearm.  No laceration  ____________________________________________    LABS (pertinent positives/negatives) (all labs ordered are listed, but only abnormal results are displayed) Labs Reviewed  COMPREHENSIVE METABOLIC PANEL - Abnormal; Notable for the following components:      Result Value   Sodium 133 (*)    Potassium 3.3 (*)    Glucose, Bld 112 (*)    All other components within normal limits  CBC WITH DIFFERENTIAL/PLATELET - Abnormal; Notable for the following components:   RBC 4.31 (*)    HCT 39.9 (*)    All other components within normal limits  LIPASE, BLOOD   ____________________________________________   EKG    ____________________________________________    RADIOLOGY  Dg Forearm Right  Result Date: 03/12/2018 CLINICAL DATA:  37 y/o M; fall from ladder landing on the right arm with multiple abrasions. Diffuse tenderness of  the right upper extremity from humerus to wrist. EXAM: RIGHT FOREARM - 2 VIEW; RIGHT HUMERUS - 2+ VIEW COMPARISON:  None. FINDINGS: Right forearm: There is no evidence of fracture or other focal bone lesions. No radiopaque foreign body. Right humerus: There is no evidence of fracture or other focal bone lesions. No radiopaque foreign body. IMPRESSION: No acute fracture or dislocation identified. Electronically Signed   By: Mitzi HansenLance  Furusawa-Stratton M.D.   On: 03/12/2018 14:34   Ct Chest W Contrast  Result Date: 03/12/2018 CLINICAL DATA:  Fall from ladder. EXAM: CT CHEST, ABDOMEN, AND PELVIS WITH CONTRAST TECHNIQUE: Multidetector CT imaging of the chest, abdomen and pelvis was performed following the standard protocol during bolus  administration of intravenous contrast. CONTRAST:  ISOVUE-300 IOPAMIDOL (ISOVUE-300) INJECTION 61% COMPARISON:  None. FINDINGS: CT CHEST FINDINGS Cardiovascular: No contour abnormality of the thoracic aorta to suggest aortic injury. No mediastinal hematoma. No pericardial effusion. Small amount residual thymus in the anterior mediastinum Mediastinum/Nodes: Trachea and esophagus normal. Lungs/Pleura: No pneumothorax.  No pulmonary contusion. Round nodule in the RIGHT middle lobe measures 7 mm (image 69/4). Musculoskeletal: No rib fracture.  No scapular fracture. CT ABDOMEN PELVIS FINDINGS Hepatobiliary: No hepatic laceration.  Gallstone noted. Pancreas: Normal pancreas. Spleen: No splenic laceration. Adrenals/Urinary Tract: Adrenal glands are normal. Kidneys enhance uniformly. Bladder intact Stomach/Bowel: No evidence of bowel injury. No mesenteric injury. No free fluid the abdomen pelvis. Vascular/Lymphatic: No evidence of abdominal aortic injury. Reproductive: Prostate normal Other: No free fluid or free air.  Soft tissue injury evident. Musculoskeletal: Fracture of the RIGHT transverse process at L4 and L3 with minimal displacement at L4. No paraspinal hematoma. No fracture of  the vertebral body proper. No subluxation. IMPRESSION: Chest Impression: 1. No evidence of aortic injury or thoracic injury. 2. RIGHT middle lobe pulmonary nodule. Non-contrast chest CT at 6-12 months is recommended. If the nodule is stable at time of repeat CT, then future CT at 18-24 months (from today's scan) is considered optional for low-risk patients, but is recommended for high-risk patients. This recommendation follows the consensus statement: Guidelines for Management of Incidental Pulmonary Nodules Detected on CT Images: From the Fleischner Society 2017; Radiology 2017; 284:228-243. Abdomen / Pelvis Impression: 1. Minimally displaced TRANSVERSE PROCESS FRACTURES on the RIGHT at L3 and L4. 2. No solid organ injury the abdomen pelvis. 3. Gallstone noted. Electronically Signed   By: Genevive Bi M.D.   On: 03/12/2018 14:17   Ct Abdomen Pelvis W Contrast  Result Date: 03/12/2018 CLINICAL DATA:  Fall from ladder. EXAM: CT CHEST, ABDOMEN, AND PELVIS WITH CONTRAST TECHNIQUE: Multidetector CT imaging of the chest, abdomen and pelvis was performed following the standard protocol during bolus administration of intravenous contrast. CONTRAST:  ISOVUE-300 IOPAMIDOL (ISOVUE-300) INJECTION 61% COMPARISON:  None. FINDINGS: CT CHEST FINDINGS Cardiovascular: No contour abnormality of the thoracic aorta to suggest aortic injury. No mediastinal hematoma. No pericardial effusion. Small amount residual thymus in the anterior mediastinum Mediastinum/Nodes: Trachea and esophagus normal. Lungs/Pleura: No pneumothorax.  No pulmonary contusion. Round nodule in the RIGHT middle lobe measures 7 mm (image 69/4). Musculoskeletal: No rib fracture.  No scapular fracture. CT ABDOMEN PELVIS FINDINGS Hepatobiliary: No hepatic laceration.  Gallstone noted. Pancreas: Normal pancreas. Spleen: No splenic laceration. Adrenals/Urinary Tract: Adrenal glands are normal. Kidneys enhance uniformly. Bladder intact Stomach/Bowel: No  evidence of bowel injury. No mesenteric injury. No free fluid the abdomen pelvis. Vascular/Lymphatic: No evidence of abdominal aortic injury. Reproductive: Prostate normal Other: No free fluid or free air.  Soft tissue injury evident. Musculoskeletal: Fracture of the RIGHT transverse process at L4 and L3 with minimal displacement at L4. No paraspinal hematoma. No fracture of the vertebral body proper. No subluxation. IMPRESSION: Chest Impression: 1. No evidence of aortic injury or thoracic injury. 2. RIGHT middle lobe pulmonary nodule. Non-contrast chest CT at 6-12 months is recommended. If the nodule is stable at time of repeat CT, then future CT at 18-24 months (from today's scan) is considered optional for low-risk patients, but is recommended for high-risk patients. This recommendation follows the consensus statement: Guidelines for Management of Incidental Pulmonary Nodules Detected on CT Images: From the Fleischner Society 2017; Radiology 2017; 284:228-243. Abdomen / Pelvis Impression: 1. Minimally displaced  TRANSVERSE PROCESS FRACTURES on the RIGHT at L3 and L4. 2. No solid organ injury the abdomen pelvis. 3. Gallstone noted. Electronically Signed   By: Genevive Bi M.D.   On: 03/12/2018 14:17   Dg Humerus Right  Result Date: 03/12/2018 CLINICAL DATA:  37 y/o M; fall from ladder landing on the right arm with multiple abrasions. Diffuse tenderness of the right upper extremity from humerus to wrist. EXAM: RIGHT FOREARM - 2 VIEW; RIGHT HUMERUS - 2+ VIEW COMPARISON:  None. FINDINGS: Right forearm: There is no evidence of fracture or other focal bone lesions. No radiopaque foreign body. Right humerus: There is no evidence of fracture or other focal bone lesions. No radiopaque foreign body. IMPRESSION: No acute fracture or dislocation identified. Electronically Signed   By: Mitzi Hansen M.D.   On: 03/12/2018 14:34     ____________________________________________   PROCEDURES Procedures  ____________________________________________  DIFFERENTIAL DIAGNOSIS   Pneumothorax, rib fracture, aortic injury, bowel perforation, liver laceration, splenic laceration, humerus fracture, forearm fracture  CLINICAL IMPRESSION / ASSESSMENT AND PLAN / ED COURSE  Pertinent labs & imaging results that were available during my care of the patient were reviewed by me and considered in my medical decision making (see chart for details).    Patient presents after fall from a high risk height.  No head injury.  No neurologic symptoms at present time.  X-rays of the right upper extremity for possible fracture.  CT scans of the chest abdomen pelvis for vascular versus hollow organ injury. Clinical Course as of Mar 12 1605  Mon Mar 12, 2018  1433 X-rays of the right humerus and forearm reviewed by me, do not show any acute fractures.  Joints are located.Reports of CT chest abdomen pelvis reviewed, no acute traumatic injuries except for minimally displaced transverse process fractures on the right at L3 and L4.   [PS]  1459 Discussed with neurosurgery Dr. Vonita Moss who advises no specific management needed for the transverse process fractures except for pain control.  No worry about stability or neurologic impairment.  Suitable for discharge home and outpatient follow-up.   [PS]    Clinical Course User Index [PS] Sharman Cheek, MD     ----------------------------------------- 4:09 PM on 03/12/2018 -----------------------------------------  Patient feeling better with pain control.  Suitable for discharge home.  Opioid medication precautions provided.  Controlled substance database reviewed, low risk for abuse.  ____________________________________________   FINAL CLINICAL IMPRESSION(S) / ED DIAGNOSES    Final diagnoses:  Fall, initial encounter  Chest wall contusion, right, initial encounter  Abrasion of  right arm, initial encounter  Lumbar transverse process fracture, closed, initial encounter [facility])     ED Discharge Orders         Ordered    naproxen (NAPROSYN) 500 MG tablet  2 times daily with meals     03/12/18 1604    oxyCODONE-acetaminophen (PERCOCET) 5-325 MG tablet  Every 6 hours PRN     03/12/18 1604          Portions of this note were generated with dragon dictation software. Dictation errors may occur despite best attempts at proofreading.    Sharman Cheek, MD 03/12/18 [phone]

## 2018-03-12 NOTE — Discharge Instructions (Signed)
Your CT scan shows fracture of the right transverse process of your L3 and L4 lumbar vertebrae.  Take pain medication as needed and follow up with primary care for further management.

## 2019-04-09 ENCOUNTER — Emergency Department: Payer: Self-pay

## 2019-04-09 ENCOUNTER — Other Ambulatory Visit: Payer: Self-pay

## 2019-04-09 ENCOUNTER — Encounter: Payer: Self-pay | Admitting: Emergency Medicine

## 2019-04-09 ENCOUNTER — Emergency Department
Admission: EM | Admit: 2019-04-09 | Discharge: 2019-04-09 | Disposition: A | Discharge status: Home / Self Care | Payer: Self-pay | Attending: Student in an Organized Health Care Education/Training Program | Admitting: Student in an Organized Health Care Education/Training Program

## 2019-04-09 DIAGNOSIS — Y999 Unspecified external cause status: Secondary | ICD-10-CM | POA: Insufficient documentation

## 2019-04-09 DIAGNOSIS — S62616A Displaced fracture of proximal phalanx of right little finger, initial encounter for closed fracture: Secondary | ICD-10-CM | POA: Insufficient documentation

## 2019-04-09 DIAGNOSIS — Y939 Activity, unspecified: Secondary | ICD-10-CM | POA: Insufficient documentation

## 2019-04-09 DIAGNOSIS — F17210 Nicotine dependence, cigarettes, uncomplicated: Secondary | ICD-10-CM | POA: Insufficient documentation

## 2019-04-09 DIAGNOSIS — S62644A Nondisplaced fracture of proximal phalanx of right ring finger, initial encounter for closed fracture: Secondary | ICD-10-CM | POA: Insufficient documentation

## 2019-04-09 DIAGNOSIS — W230XXA Caught, crushed, jammed, or pinched between moving objects, initial encounter: Secondary | ICD-10-CM | POA: Insufficient documentation

## 2019-04-09 DIAGNOSIS — Y9289 Other specified places as the place of occurrence of the external cause: Secondary | ICD-10-CM | POA: Insufficient documentation

## 2019-04-09 DIAGNOSIS — Z79899 Other long term (current) drug therapy: Secondary | ICD-10-CM | POA: Insufficient documentation

## 2019-04-09 MED ORDER — LIDOCAINE HCL (PF) 1 % IJ SOLN
10.0000 mL | Freq: Once | INTRAMUSCULAR | Status: AC
  Administered 2019-04-09: 10 mL
  Filled 2019-04-09: qty 10

## 2019-04-09 MED ORDER — ONDANSETRON 8 MG PO TBDP
8.0000 mg | ORAL_TABLET | Freq: Once | ORAL | Status: AC
  Administered 2019-04-09: 8 mg via ORAL
  Filled 2019-04-09: qty 1

## 2019-04-09 MED ORDER — MELOXICAM 15 MG PO TABS: 15.0000 mg | ORAL_TABLET | Freq: Every day | ORAL | 0 refills | Status: DC

## 2019-04-09 MED ORDER — OXYCODONE-ACETAMINOPHEN 5-325 MG PO TABS: 1.0000 | ORAL_TABLET | Freq: Four times a day (QID) | ORAL | 0 refills | Status: DC | PRN

## 2019-04-09 MED ORDER — OXYCODONE-ACETAMINOPHEN 5-325 MG PO TABS
1.0000 | ORAL_TABLET | Freq: Once | ORAL | Status: AC
  Administered 2019-04-09: 1 via ORAL
  Filled 2019-04-09: qty 1

## 2019-04-09 MED ORDER — ONDANSETRON 4 MG PO TBDP: 4.0000 mg | ORAL_TABLET | Freq: Three times a day (TID) | ORAL | 0 refills | Status: DC | PRN

## 2019-04-09 NOTE — ED Provider Notes (Signed)
[facility] Emergency Department Provider Note  ____________________________________________  Time seen: Approximately 3:36 PM  I have reviewed the triage vital signs and the nursing notes.   HISTORY  Chief Complaint Hand Injury    HPI John Riddle is a 38 y.o. male who presents the emergency department complaining of an injury to the right hand.  Patient reports that he was at work, a rope off of the back of 1 of his machines caught his fourth and fifth finger, pulling them violently.  Patient was able to free his finger and denies sustaining lacerations but states that he had an obvious deformity to his pinky finger and is having pain to both the fourth and fifth digits.  Patient sustained no other injuries.  No medications prior to arrival.  Patient states that he is not following this under Worker's Comp.         Past Medical History:  Diagnosis Date  . MRSA cellulitis     There are no active problems to display for this patient.   Past Surgical History:  Procedure Laterality Date  . HAND SURGERY Left    thumb    Prior to Admission medications   Medication Sig Start Date End Date Taking? Authorizing Provider  acetaminophen-codeine (TYLENOL #3) 300-30 MG tablet Take 1 tablet by mouth every 6 (six) hours as needed for moderate pain. 01/26/16   Menshew, Charlesetta Ivory, PA-C  meloxicam (MOBIC) 15 MG tablet Take 1 tablet (15 mg total) by mouth daily. 04/09/19   Kendric Sindelar, Delorise Royals, PA-C  ondansetron (ZOFRAN-ODT) 4 MG disintegrating tablet Take 1 tablet (4 mg total) by mouth every 8 (eight) hours as needed for nausea or vomiting. 04/09/19   Rual Vermeer, Delorise Royals, PA-C  oxyCODONE-acetaminophen (PERCOCET/ROXICET) 5-325 MG tablet Take 1 tablet by mouth every 6 (six) hours as needed for severe pain. 04/09/19   Donie Lemelin, Delorise Royals, PA-C    Allergies Bactrim [sulfamethoxazole-trimethoprim], Clindamycin/lincomycin, Codeine, and Hydrocodone  No family  history on file.  Social History Social History   Tobacco Use  . Smoking status: Current Every Day Smoker    Packs/day: 1.00    Types: Cigarettes  . Smokeless tobacco: Never Used  Substance Use Topics  . Alcohol use: No  . Drug use: No     Review of Systems  Constitutional: No fever/chills Eyes: No visual changes. No discharge ENT: No upper respiratory complaints. Cardiovascular: no chest pain. Respiratory: no cough. No SOB. Gastrointestinal: No abdominal pain.  No nausea, no vomiting.  No diarrhea.  No constipation. Musculoskeletal: Positive for injury to the fourth and fifth digit right hand Skin: Negative for rash, abrasions, lacerations, ecchymosis. Neurological: Negative for headaches, focal weakness or numbness. 10-point ROS otherwise negative.  ____________________________________________   PHYSICAL EXAM:  VITAL SIGNS: ED Triage Vitals  Enc Vitals Group     BP 04/09/19 1504 133/85     Pulse Rate 04/09/19 1504 63     Resp 04/09/19 1504 18     Temp 04/09/19 1504 98.9 F (37.2 C)     Temp Source 04/09/19 1504 Oral     SpO2 04/09/19 1504 98 %     Weight 04/09/19 1458 190 lb (86.2 kg)     Height 04/09/19 1458 6' (1.829 m)     Head Circumference --      Peak Flow --      Pain Score 04/09/19 1457 10     Pain Loc --      Pain Edu? --  Excl. in [place]? --      Constitutional: Alert and oriented. Well appearing and in no acute distress. Eyes: Conjunctivae are normal. PERRL. EOMI. Head: Atraumatic. ENT:      Ears:       Nose: No congestion/rhinnorhea.      Mouth/Throat: Mucous membranes are moist.  Neck: No stridor.    Cardiovascular: Normal rate, regular rhythm. Normal S1 and S2.  Good peripheral circulation. Respiratory: Normal respiratory effort without tachypnea or retractions. Lungs CTAB. Good air entry to the bases with no decreased or absent breath sounds. Musculoskeletal: Full range of motion to all extremities. No gross deformities appreciated.   Visualization of the right hand reveals edema about the fourth finger and obvious deformity of the fifth about the base of the finger.  Patient has good range of motion to the first, second and third digit, no attempted range of motion of the fourth or fifth digit at this time.  Sensation intact all digits.  Capillary refill intact all digits.  Palpation reveals exquisite tenderness over the proximal phalanx of the fourth digit, exquisite tenderness over the proximal fifth phalanx.  Palpation over the proximal fifth phalanx reveals palpable deformity.  No other tenderness to palpation of the osseous structures of the hand. Neurologic:  Normal speech and language. No gross focal neurologic deficits are appreciated.  Skin:  Skin is warm, dry and intact. No rash noted. Psychiatric: Mood and affect are normal. Speech and behavior are normal. Patient exhibits appropriate insight and judgement.   ____________________________________________   LABS (all labs ordered are listed, but only abnormal results are displayed)  Labs Reviewed - No data to display ____________________________________________  EKG   ____________________________________________  RADIOLOGY I personally viewed and evaluated these images as part of my medical decision making, as well as reviewing the written report by the radiologist.  Dg Hand Complete Right  Result Date: 04/09/2019 CLINICAL DATA:  Patient states his hand got caught/trapped in a machine at work. Pain in the 1st to 5th digits. EXAM: RIGHT HAND - COMPLETE 3+ VIEW COMPARISON:  Right hand radiographs 04/06/2006 FINDINGS: There is a complete transverse fracture through the base of the fifth proximal phalanx with significant displacement. There is a complex oblique fracture through the proximal phalanx of the fourth digit. No evidence of dislocation. Deformity in the fifth metacarpal likely related to prior injury. No significant arthritic change. No focal bony lesion.  IMPRESSION: 1. Fractures of the fifth proximal and fourth proximal phalanges of the right hand as described above. 2. Deformity in the fifth metacarpal likely related to prior injury. Electronically Signed   By: Emmaline Kluver M.D.   On: 04/09/2019 15:34    ____________________________________________    PROCEDURES  Procedure(s) performed:    Reduction of fracture  Date/Time: 04/09/2019 4:08 PM Performed by: Racheal Patches, PA-C Authorized by: Racheal Patches, PA-C  Consent: Verbal consent obtained. Risks and benefits: risks, benefits and alternatives were discussed Consent given by: patient Imaging studies: imaging studies available Patient identity confirmed: verbally with patient Time out: Immediately prior to procedure a "time out" was called to verify the correct patient, procedure, equipment, support staff and site/side marked as required. Preparation: Patient was prepped and draped in the usual sterile fashion. Local anesthesia used: yes Anesthesia: digital block and hematoma block  Anesthesia: Local anesthesia used: yes Local Anesthetic: lidocaine 1% without epinephrine Anesthetic total: 4 mL  Sedation: Patient sedated: no  Patient tolerance: patient tolerated the procedure well with no immediate complications Comments: Digital  block/hematoma block performed.  Given the location, introduced lidocaine directly into the ends of the fracture.  This did have some digital block features, primary goal was hematoma block of the fracture site.  Stabilizing the patient's hand with pressure over the MCP joint, edges of the proximal phalanx were appreciated through the skin.  Using traction over the proximal phalanx with manipulation, palpable and visible reduction was performed.  At this time, finger is in anatomic position.  No repeat imaging as I would not repeat reduction technique as it has reduced visibly and I do not feel that I could further improve reduction  with any further attempts.  Therefore, patient's fourth and fifth digits are buddy taped and ulnar gutter splint is applied.  Patient's capillary refill is intact both prior to and status post procedure.  After application of lidocaine, sensation is drastically reduced in the finger as expected.  Marland KitchenSplint Application  Date/Time: 04/09/2019 4:11 PM Performed by: Darletta Moll, PA-C Authorized by: Darletta Moll, PA-C   Consent:    Consent obtained:  Verbal   Consent given by:  Patient   Risks discussed:  Pain, swelling and numbness Pre-procedure details:    Sensation:  Normal Procedure details:    Laterality:  Right   Location:  Finger   Finger:  R ring finger and R small finger   Splint type:  Ulnar gutter   Supplies:  Cotton padding, Ortho-Glass and elastic bandage Post-procedure details:    Pain:  Improved   Sensation:  Normal   Patient tolerance of procedure:  Tolerated well, no immediate complications      Medications  oxyCODONE-acetaminophen (PERCOCET/ROXICET) 5-325 MG per tablet 1 tablet (1 tablet Oral Given 04/09/19 1607)  ondansetron (ZOFRAN-ODT) disintegrating tablet 8 mg (8 mg Oral Given 04/09/19 1607)  lidocaine (PF) (XYLOCAINE) 1 % injection 10 mL (10 mLs Infiltration Given 04/09/19 1608)     ____________________________________________   INITIAL IMPRESSION / ASSESSMENT AND PLAN / ED COURSE  Pertinent labs & imaging results that were available during my care of the patient were reviewed by me and considered in my medical decision making (see chart for details).  Review of the [place] CSRS was performed in accordance of the [place] prior to dispensing any controlled drugs.           Patient's diagnosis is consistent with fractures of the fourth and fifth digits right hand.  Patient sustained an injury where he wrote became caught around the fourth and fifth digits.  Patient had an obvious deformity to the proximal fifth finger.  X-ray reveals  fractures as described above.  Fracture reduction is attempted for the fifth digit.  This had a good visible and palpable reduction.  No follow-up films as I do not feel I could improve anatomic position at this time.  Patient has a splint applied.  Referred to hand surgery.  Patient will be prescribed anti-inflammatories as well as pain medication..Patient is given ED precautions to return to the ED for any worsening or new symptoms.     ____________________________________________  FINAL CLINICAL IMPRESSION(S) / ED DIAGNOSES  Final diagnoses:  Closed displaced fracture of proximal phalanx of right little finger, initial encounter  Closed nondisplaced fracture of proximal phalanx of right ring finger, initial encounter      NEW MEDICATIONS STARTED DURING THIS VISIT:  ED Discharge Orders         Ordered    meloxicam (MOBIC) 15 MG tablet  Daily     04/09/19 1623  oxyCODONE-acetaminophen (PERCOCET/ROXICET) 5-325 MG tablet  Every 6 hours PRN     04/09/19 1623    ondansetron (ZOFRAN-ODT) 4 MG disintegrating tablet  Every 8 hours PRN     04/09/19 1623              This chart was dictated using voice recognition software/Dragon. Despite best efforts to proofread, errors can occur which can change the meaning. Any change was purely unintentional.    Racheal PatchesCuthriell, Gina Costilla D, PA-C 04/09/19 1624    Willy Eddyobinson, Patrick, MD 04/09/19 1910

## 2019-04-09 NOTE — ED Notes (Signed)
See triage note  Presents with injury to right hand  Deformity and swelling noted to 4th and 5 th digits

## 2019-04-09 NOTE — ED Triage Notes (Signed)
Pt in via POV, reports incident at work injuring right hand, wrist.  Swelling, deformity noted.  Ambulatory to triage, NAD noted at this time.

## 2019-04-25 ENCOUNTER — Other Ambulatory Visit: Payer: Self-pay

## 2019-04-25 ENCOUNTER — Emergency Department
Admission: EM | Admit: 2019-04-25 | Discharge: 2019-04-25 | Disposition: A | Discharge status: Home / Self Care | Payer: Self-pay | Attending: Emergency Medicine | Admitting: Emergency Medicine

## 2019-04-25 ENCOUNTER — Encounter: Payer: Self-pay | Admitting: Emergency Medicine

## 2019-04-25 DIAGNOSIS — G894 Chronic pain syndrome: Secondary | ICD-10-CM | POA: Insufficient documentation

## 2019-04-25 DIAGNOSIS — R52 Pain, unspecified: Secondary | ICD-10-CM

## 2019-04-25 DIAGNOSIS — F17210 Nicotine dependence, cigarettes, uncomplicated: Secondary | ICD-10-CM | POA: Insufficient documentation

## 2019-04-25 MED ORDER — OXYCODONE-ACETAMINOPHEN 5-325 MG PO TABS: 1.0000 | ORAL_TABLET | Freq: Four times a day (QID) | ORAL | 0 refills | Status: AC | PRN

## 2019-04-25 MED ORDER — ONDANSETRON HCL 4 MG PO TABS: 4.0000 mg | ORAL_TABLET | Freq: Three times a day (TID) | ORAL | 0 refills | Status: AC | PRN

## 2019-04-25 MED ORDER — ONDANSETRON 4 MG PO TBDP
4.0000 mg | ORAL_TABLET | Freq: Once | ORAL | Status: AC
  Administered 2019-04-25: 4 mg via ORAL
  Filled 2019-04-25: qty 1

## 2019-04-25 MED ORDER — OXYCODONE-ACETAMINOPHEN 5-325 MG PO TABS
1.0000 | ORAL_TABLET | Freq: Once | ORAL | Status: AC
  Administered 2019-04-25: 1 via ORAL
  Filled 2019-04-25: qty 1

## 2019-04-25 NOTE — ED Triage Notes (Signed)
PT c/o RT hand pain. Scheduled surgery for tomorrow but pt had + covid test yesterday at [facility]. PT has no symptoms, denies any SOB , cough or fever. PT is here for pain control. States he has ran out of pain meds.

## 2019-04-25 NOTE — ED Notes (Signed)
Topaz in room not working, pt has COVID and cannot sign printed paper. Patient verbalizes understanding of discharge instructions and followup care

## 2019-04-25 NOTE — ED Provider Notes (Signed)
[facility] Emergency Department Provider Note  ____________________________________________  Time seen: Approximately 8:25 PM  I have reviewed the triage vital signs and the nursing notes.   HISTORY  Chief Complaint Hand Pain    HPI John Riddle is a 38 y.o. male presents to the emergency department for pain management.  Patient is supposed to have surgery on the right hand by Dr. Alvia Grove with [facility] orthopedics but states he tested positive for COVID-19 and surgery was canceled.  Patient has no other concerns.        Past Medical History:  Diagnosis Date  . MRSA cellulitis     There are no active problems to display for this patient.   Past Surgical History:  Procedure Laterality Date  . HAND SURGERY Left    thumb    Prior to Admission medications   Medication Sig Start Date End Date Taking? Authorizing Provider  acetaminophen-codeine (TYLENOL #3) 300-30 MG tablet Take 1 tablet by mouth every 6 (six) hours as needed for moderate pain. 01/26/16   Menshew, Dannielle Karvonen, PA-C  meloxicam (MOBIC) 15 MG tablet Take 1 tablet (15 mg total) by mouth daily. 04/09/19   Cuthriell, Charline Bills, PA-C  ondansetron (ZOFRAN) 4 MG tablet Take 1 tablet (4 mg total) by mouth every 8 (eight) hours as needed for up to 5 days for nausea or vomiting. 04/25/19 04/30/19  Lannie Fields, PA-C  ondansetron (ZOFRAN-ODT) 4 MG disintegrating tablet Take 1 tablet (4 mg total) by mouth every 8 (eight) hours as needed for nausea or vomiting. 04/09/19   Cuthriell, Charline Bills, PA-C  oxyCODONE-acetaminophen (PERCOCET/ROXICET) 5-325 MG tablet Take 1 tablet by mouth every 6 (six) hours as needed for up to 5 days. 04/25/19 04/30/19  Lannie Fields, PA-C    Allergies Bactrim [sulfamethoxazole-trimethoprim], Clindamycin/lincomycin, Codeine, and Hydrocodone  No family history on file.  Social History Social History   Tobacco Use  . Smoking status: Current Every Day Smoker   Packs/day: 1.00    Types: Cigarettes  . Smokeless tobacco: Never Used  Substance Use Topics  . Alcohol use: No  . Drug use: No     Review of Systems  Constitutional: No fever/chills Eyes: No visual changes. No discharge ENT: No upper respiratory complaints. Cardiovascular: no chest pain. Respiratory: no cough. No SOB. Gastrointestinal: No abdominal pain.  No nausea, no vomiting.  No diarrhea.  No constipation. Musculoskeletal: Patient has right hand pain.  Skin: Negative for rash, abrasions, lacerations, ecchymosis. Neurological: Negative for headaches, focal weakness or numbness.   ____________________________________________   PHYSICAL EXAM:  VITAL SIGNS: ED Triage Vitals  Enc Vitals Group     BP 04/25/19 1840 (!) 139/93     Pulse Rate 04/25/19 1840 71     Resp 04/25/19 1840 18     Temp 04/25/19 1840 98.9 F (37.2 C)     Temp Source 04/25/19 1840 Oral     SpO2 04/25/19 1840 98 %     Weight 04/25/19 1840 190 lb (86.2 kg)     Height 04/25/19 1840 6' (1.829 m)     Head Circumference --      Peak Flow --      Pain Score 04/25/19 1847 10     Pain Loc --      Pain Edu? --      Excl. in [place]? --      Constitutional: Alert and oriented. Well appearing and in no acute distress. Eyes: Conjunctivae are normal. PERRL. EOMI. Head:  Atraumatic. ENT: Cardiovascular: Normal rate, regular rhythm. Normal S1 and S2.  Good peripheral circulation. Respiratory: Normal respiratory effort without tachypnea or retractions. Lungs CTAB. Good air entry to the bases with no decreased or absent breath sounds. Gastrointestinal: Bowel sounds 4 quadrants. Soft and nontender to palpation. No guarding or rigidity. No palpable masses. No distention. No CVA tenderness. Musculoskeletal: Patient is able to move all 5 right fingers.  Capillary refill less than 2 seconds on the right.  Palpable radial pulse, right. Neurologic:  Normal speech and language. No gross focal neurologic deficits are  appreciated.  Skin:  Skin is warm, dry and intact. No rash noted. Psychiatric: Mood and affect are normal. Speech and behavior are normal. Patient exhibits appropriate insight and judgement.   ____________________________________________   LABS (all labs ordered are listed, but only abnormal results are displayed)  Labs Reviewed - No data to display ____________________________________________  EKG   ____________________________________________  RADIOLOGY  No results found.  ____________________________________________    PROCEDURES  Procedure(s) performed:    Procedures    Medications  oxyCODONE-acetaminophen (PERCOCET/ROXICET) 5-325 MG per tablet 1 tablet (has no administration in time range)  ondansetron (ZOFRAN-ODT) disintegrating tablet 4 mg (has no administration in time range)     ____________________________________________   INITIAL IMPRESSION / ASSESSMENT AND PLAN / ED COURSE  Pertinent labs & imaging results that were available during my care of the patient were reviewed by me and considered in my medical decision making (see chart for details).  Review of the [place] CSRS was performed in accordance of the [facility] prior to dispensing any controlled drugs.           Assessment and plan Pain management 38 year old male presents to the emergency department for pain management as his right hand surgery was canceled due to active COVID-19 infection.  Patient was discharged with a 5-day course of Percocet but was cautioned that he would need to use medication sparingly only when pain is most severe.  He was also advised to use ibuprofen for further pain relief.  He voiced understanding.  All patient questions were answered.     ____________________________________________  FINAL CLINICAL IMPRESSION(S) / ED DIAGNOSES  Final diagnoses:  Pain management      NEW MEDICATIONS STARTED DURING THIS VISIT:  ED Discharge Orders         Ordered     oxyCODONE-acetaminophen (PERCOCET/ROXICET) 5-325 MG tablet  Every 6 hours PRN     04/25/19 2023    ondansetron (ZOFRAN) 4 MG tablet  Every 8 hours PRN     04/25/19 2024              This chart was dictated using voice recognition software/Dragon. Despite best efforts to proofread, errors can occur which can change the meaning. Any change was purely unintentional.    Orvil Feil, PA-C 04/25/19 2028    Minna Antis, MD 04/25/19 2231

## 2019-04-25 NOTE — ED Notes (Signed)
Pt stating he had COVID-19 rapid test done on 10/28 and it was positive. Pt stating he is not having any symptoms. Pt was scheduled to have surgery on his hand on 10/30.

## 2019-04-25 NOTE — ED Notes (Signed)
Pt is here for pain medication since he cannot have his surgery tomorrow.

## 2020-09-05 ENCOUNTER — Encounter (HOSPITAL_COMMUNITY): Payer: Self-pay | Admitting: Emergency Medicine

## 2020-09-05 ENCOUNTER — Emergency Department (HOSPITAL_COMMUNITY): Payer: Self-pay

## 2020-09-05 ENCOUNTER — Other Ambulatory Visit: Payer: Self-pay

## 2020-09-05 ENCOUNTER — Emergency Department (HOSPITAL_COMMUNITY)
Admission: EM | Admit: 2020-09-05 | Discharge: 2020-09-05 | Disposition: A | Discharge status: Home / Self Care | Payer: Self-pay | Attending: Emergency Medicine | Admitting: Emergency Medicine

## 2020-09-05 DIAGNOSIS — S92334A Nondisplaced fracture of third metatarsal bone, right foot, initial encounter for closed fracture: Secondary | ICD-10-CM | POA: Insufficient documentation

## 2020-09-05 DIAGNOSIS — W208XXA Other cause of strike by thrown, projected or falling object, initial encounter: Secondary | ICD-10-CM | POA: Insufficient documentation

## 2020-09-05 DIAGNOSIS — S9781XA Crushing injury of right foot, initial encounter: Secondary | ICD-10-CM

## 2020-09-05 DIAGNOSIS — F17210 Nicotine dependence, cigarettes, uncomplicated: Secondary | ICD-10-CM | POA: Insufficient documentation

## 2020-09-05 MED ORDER — OXYCODONE-ACETAMINOPHEN 5-325 MG PO TABS
1.0000 | ORAL_TABLET | Freq: Once | ORAL | Status: AC
  Administered 2020-09-05: 1 via ORAL
  Filled 2020-09-05: qty 1

## 2020-09-05 MED ORDER — OXYCODONE-ACETAMINOPHEN 5-325 MG PO TABS: 1.0000 | ORAL_TABLET | Freq: Four times a day (QID) | ORAL | 0 refills | Status: DC | PRN

## 2020-09-05 NOTE — ED Triage Notes (Signed)
Pt states a piece of wood fell on R foot yesterday.  C/o pain and swelling.  States he was seen in [place] and had x-rays but he is concerned because he doesn't think they did an x-ray on both sides.  Pt ambulatory with crutches.  ACE bandage in place.

## 2020-09-05 NOTE — Progress Notes (Signed)
Orthopedic Tech Progress Note Patient Details:  WANDELL SCULLION [birth year] [id number]  Ortho Devices Type of Ortho Device: Post (short) splint Splint Material: Fiberglass Ortho Device/Splint Location: RLE Ortho Device/Splint Interventions: Ordered,Application,Adjustment   Post Interventions Patient Tolerated: Well Instructions Provided: Care of device,Poper ambulation with device   Gissela Bloch 09/05/2020, 5:44 PM

## 2020-09-05 NOTE — ED Provider Notes (Signed)
[facility] EMERGENCY DEPARTMENT Provider Note   CSN: [id number] Arrival date & time: 09/05/20  1334     History Chief Complaint  Patient presents with  . Foot Pain    John Riddle is a 40 y.o. male.  Patient presents the emergency department for evaluation of right foot pain.  Patient states that he dropped a heavy piece of wood on his foot yesterday.  He was seen at an outside emergency department and had x-rays of his ankle.  Patient complains of continued pain and swelling in the foot and thinks that he did not have x-rays on the foot, where it is more tender.  He has been using crutches and has had the ankle and foot wrapped in an Ace bandage.  No numbness or tingling.          Past Medical History:  Diagnosis Date  . MRSA cellulitis     There are no problems to display for this patient.   Past Surgical History:  Procedure Laterality Date  . HAND SURGERY Left    thumb       No family history on file.  Social History   Tobacco Use  . Smoking status: Current Every Day Smoker    Packs/day: 1.00    Types: Cigarettes  . Smokeless tobacco: Never Used  Vaping Use  . Vaping Use: Never used  Substance Use Topics  . Alcohol use: No  . Drug use: No    Home Medications Prior to Admission medications   Medication Sig Start Date End Date Taking? Authorizing Provider  acetaminophen-codeine (TYLENOL #3) 300-30 MG tablet Take 1 tablet by mouth every 6 (six) hours as needed for moderate pain. 01/26/16   Menshew, Charlesetta Ivory, PA-C  meloxicam (MOBIC) 15 MG tablet Take 1 tablet (15 mg total) by mouth daily. 04/09/19   Cuthriell, Delorise Royals, PA-C  ondansetron (ZOFRAN-ODT) 4 MG disintegrating tablet Take 1 tablet (4 mg total) by mouth every 8 (eight) hours as needed for nausea or vomiting. 04/09/19   Cuthriell, Delorise Royals, PA-C    Allergies    Bactrim [sulfamethoxazole-trimethoprim], Clindamycin/lincomycin, Codeine, and Hydrocodone  Review of Systems    Review of Systems  Musculoskeletal: Positive for arthralgias, gait problem and joint swelling. Negative for back pain and neck pain.  Skin: Negative for wound.  Neurological: Negative for weakness and numbness.    Physical Exam Updated Vital Signs BP (!) 120/93 (BP Location: Left Arm)   Pulse 79   Temp 98 F (36.7 C) (Oral)   Resp 16   SpO2 98%   Physical Exam Vitals and nursing note reviewed.  Constitutional:      Appearance: He is well-developed.  HENT:     Head: Normocephalic and atraumatic.  Eyes:     Conjunctiva/sclera: Conjunctivae normal.  Cardiovascular:     Pulses: Normal pulses. No decreased pulses.  Musculoskeletal:        General: Tenderness present.     Cervical back: Normal range of motion and neck supple.     Right foot: Decreased range of motion. Swelling, tenderness and bony tenderness present. No deformity.     Comments: R foot: tenderness over the mid-foot, ecchymosis laterally, no open wounds. 2+ DP pulse.  R ankle: mild tenderness bilateral, normal ROM.   R lower leg: no tenderness  Skin:    General: Skin is warm and dry.  Neurological:     Mental Status: He is alert.     Sensory: No sensory deficit.  Comments: Motor, sensation, and vascular distal to the injury is fully intact.      ED Results / Procedures / Treatments   Labs (all labs ordered are listed, but only abnormal results are displayed) Labs Reviewed - No data to display  EKG None  Radiology DG Foot Complete Right  Result Date: 09/05/2020 CLINICAL DATA:  Trauma with pain and swelling. EXAM: RIGHT FOOT COMPLETE - 3+ VIEW COMPARISON:  None. FINDINGS: There is a lucency through the base of the third metatarsal consistent with a fracture. There is soft tissue swelling over the top of the foot. No other abnormalities identified. IMPRESSION: Fracture through the base of the third metatarsal. Electronically Signed   By: Gerome Sam III M.D   On: 09/05/2020 15:06     Procedures Procedures   Medications Ordered in ED Medications  oxyCODONE-acetaminophen (PERCOCET/ROXICET) 5-325 MG per tablet 1 tablet (has no administration in time range)    ED Course  I have reviewed the triage vital signs and the nursing notes.  Pertinent labs & imaging results that were available during my care of the patient were reviewed by me and considered in my medical decision making (see chart for details).  Patient seen and examined. X-ray ordered.   Vital signs reviewed and are as follows: BP (!) 120/93 (BP Location: Left Arm)   Pulse 79   Temp 98 F (36.7 C) (Oral)   Resp 16   SpO2 98%   X-ray reviewed.  Patient with fracture at the base of the third metatarsal.  He will be placed in a posterior splint, pain medication ordered.  Will give orthopedic follow-up.  He has crutches.  Patient counseled on use of narcotic pain medications. Counseled not to combine these medications with others containing tylenol. Urged not to drink alcohol, drive, or perform any other activities that requires focus while taking these medications. The patient verbalizes understanding and agrees with the plan.     MDM Rules/Calculators/A&P                          Foot fracture, no neurovascular compromise.   Final Clinical Impression(s) / ED Diagnoses Final diagnoses:  Closed nondisplaced fracture of third metatarsal bone of right foot, initial encounter  Crushing injury of right foot, initial encounter    Rx / DC Orders ED Discharge Orders    None       Renne Crigler, PA-C 09/05/20 1533    Arby Barrette, MD 09/05/20 1616

## 2020-09-05 NOTE — Discharge Instructions (Signed)
Please read and follow all provided instructions.  Your diagnoses today include:  1. Closed nondisplaced fracture of third metatarsal bone of right foot, initial encounter   2. Crushing injury of right foot, initial encounter     Tests performed today include:  An x-ray of the affected area - shows broken bone in the middle of the foot  Vital signs. See below for your results today.   Medications prescribed:   Percocet (oxycodone/acetaminophen) - narcotic pain medication  DO NOT drive or perform any activities that require you to be awake and alert because this medicine can make you drowsy. BE VERY CAREFUL not to take multiple medicines containing Tylenol (also called acetaminophen). Doing so can lead to an overdose which can damage your liver and cause liver failure and possibly death.  Take any prescribed medications only as directed.  Home care instructions:   Follow any educational materials contained in this packet  Use crutches and wear splint until he can follow-up with the orthopedic doctor  Follow R.I.C.E. Protocol:  R - rest your injury   I  - use ice on injury without applying directly to skin  C - compress injury with bandage or splint  E - elevate the injury as much as possible  Follow-up instructions: Please follow-up with the orthopedic physician listed for further evaluation and treatment of your foot fracture  Return instructions:   Please return if your toes or feet are numb or tingling, appear gray or blue, or you have severe pain (also elevate the leg and loosen splint or wrap if you were given one)  Please return to the Emergency Department if you experience worsening symptoms.   Please return if you have any other emergent concerns.  Additional Information:  Your vital signs today were: BP (!) 120/93 (BP Location: Left Arm)   Pulse 79   Temp 98 F (36.7 C) (Oral)   Resp 16   SpO2 98%  If your blood pressure (BP) was elevated above 135/85  this visit, please have this repeated by your doctor within one month. --------------

## 2020-09-10 ENCOUNTER — Other Ambulatory Visit: Payer: Self-pay

## 2020-09-10 ENCOUNTER — Encounter: Payer: Self-pay | Admitting: Orthopedic Surgery

## 2020-09-10 ENCOUNTER — Ambulatory Visit (INDEPENDENT_AMBULATORY_CARE_PROVIDER_SITE_OTHER): Payer: Self-pay | Admitting: Orthopedic Surgery

## 2020-09-10 DIAGNOSIS — S92334A Nondisplaced fracture of third metatarsal bone, right foot, initial encounter for closed fracture: Secondary | ICD-10-CM

## 2020-09-10 MED ORDER — OXYCODONE-ACETAMINOPHEN 5-325 MG PO TABS: 1.0000 | ORAL_TABLET | Freq: Four times a day (QID) | ORAL | 0 refills | Status: DC | PRN

## 2020-09-10 MED ORDER — MELOXICAM 15 MG PO TABS: 15.0000 mg | ORAL_TABLET | Freq: Every day | ORAL | 0 refills | Status: DC

## 2020-09-10 NOTE — Progress Notes (Signed)
Office Visit Note   Patient: John Riddle           Date of Birth: 06/24/1981           MRN: [id number] Visit Date: 09/10/2020 Requested by: No referring provider defined for this encounter. PCP: Patient, No Pcp Per  Subjective: Chief Complaint  Patient presents with  . Right Foot - Pain    HPI: John Riddle is a 40 y.o. male who presents to the office complaining of right foot pain.  Patient states that he injured his right foot on last Friday when he had a close to 1500 pound piece of wood fall across his foot over the midfoot.  He notes severe pain immediately with swelling and bruising at the time.  No previous injury to his right foot.  Never had right foot surgery.  He has been unable to bear weight on the foot since the injury.  He is nonweightbearing with crutches at this time.  He works cutting trees for work.  Rates his pain 8/10.  Taking Tylenol and ibuprofen for pain without significant relief..                ROS: All systems reviewed are negative as they relate to the chief complaint within the history of present illness.  Patient denies fevers or chills.  Assessment & Plan: Visit Diagnoses:  1. Nondisplaced fracture of third metatarsal bone, right foot, initial encounter for closed fracture     Plan: Patient is a 40 year old male who presents complaint of right foot injury.  Sustained third metatarsal base fracture on 09/04/20.  Imaging reviewed and fracture is nondisplaced.  No concern for Lisfranc injury at this time.  Plan to have patient continue nonweightbearing with crutches and a hard soled shoe.  Prescribed Percocet and Mobic for pain control.  Follow-up in 10 days for clinical recheck with new radiographs to ensure no displacement.  DVT prophylaxis not indicated as the patient will be weightbearing in a hard soled shoe.  Follow-Up Instructions: No follow-ups on file.   Orders:  No orders of the defined types were placed in this encounter.  Meds ordered this  encounter  Medications  . oxyCODONE-acetaminophen (PERCOCET/ROXICET) 5-325 MG tablet    Sig: Take 1 tablet by mouth every 6 (six) hours as needed for severe pain.    Dispense:  30 tablet    Refill:  0  . meloxicam (MOBIC) 15 MG tablet    Sig: Take 1 tablet (15 mg total) by mouth daily.    Dispense:  30 tablet    Refill:  0      Procedures: No procedures performed   Clinical Data: No additional findings.  Objective: Vital Signs: There were no vitals taken for this visit.  Physical Exam:  Constitutional: Patient appears well-developed HEENT:  Head: Normocephalic Eyes:EOM are normal Neck: Normal range of motion Cardiovascular: Normal rate Pulmonary/chest: Effort normal Neurologic: Patient is alert Skin: Skin is warm Psychiatric: Patient has normal mood and affect  Ortho Exam: Ortho exam demonstrates right foot with 1+ DP pulse.  Intact active dorsiflexion, plantarflexion, inversion, eversion.  Tenderness over the base of the third metatarsal and the midfoot.  No tenderness over the Lisfranc complex.  No tenderness over the fifth metatarsal base, calcaneus, medial/lateral malleoli, plantar fascia, tibial shaft.  No pain with stressing of the Lisfranc complex.  Ecchymosis noted throughout the foot and on the plantar aspect of the foot.  Specialty Comments:  No specialty comments available.  Imaging: No results found.   PMFS History: There are no problems to display for this patient.  Past Medical History:  Diagnosis Date  . MRSA cellulitis     History reviewed. No pertinent family history.  Past Surgical History:  Procedure Laterality Date  . HAND SURGERY Left    thumb   Social History   Occupational History  . Not on file  Tobacco Use  . Smoking status: Current Every Day Smoker    Packs/day: 1.00    Types: Cigarettes  . Smokeless tobacco: Never Used  Vaping Use  . Vaping Use: Never used  Substance and Sexual Activity  . Alcohol use: No  . Drug use: No   . Sexual activity: Not on file

## 2020-09-21 ENCOUNTER — Ambulatory Visit: Payer: Self-pay | Admitting: Orthopedic Surgery

## 2020-12-25 ENCOUNTER — Emergency Department: Payer: Self-pay

## 2020-12-25 ENCOUNTER — Emergency Department
Admission: EM | Admit: 2020-12-25 | Discharge: 2020-12-25 | Disposition: A | Discharge status: Home / Self Care | Payer: Self-pay | Attending: Emergency Medicine | Admitting: Emergency Medicine

## 2020-12-25 ENCOUNTER — Other Ambulatory Visit: Payer: Self-pay

## 2020-12-25 DIAGNOSIS — J181 Lobar pneumonia, unspecified organism: Secondary | ICD-10-CM | POA: Insufficient documentation

## 2020-12-25 DIAGNOSIS — K5790 Diverticulosis of intestine, part unspecified, without perforation or abscess without bleeding: Secondary | ICD-10-CM

## 2020-12-25 DIAGNOSIS — R1032 Left lower quadrant pain: Secondary | ICD-10-CM

## 2020-12-25 DIAGNOSIS — K8020 Calculus of gallbladder without cholecystitis without obstruction: Secondary | ICD-10-CM | POA: Insufficient documentation

## 2020-12-25 DIAGNOSIS — J189 Pneumonia, unspecified organism: Secondary | ICD-10-CM

## 2020-12-25 DIAGNOSIS — R109 Unspecified abdominal pain: Secondary | ICD-10-CM

## 2020-12-25 DIAGNOSIS — Z20822 Contact with and (suspected) exposure to covid-19: Secondary | ICD-10-CM | POA: Insufficient documentation

## 2020-12-25 DIAGNOSIS — F17210 Nicotine dependence, cigarettes, uncomplicated: Secondary | ICD-10-CM | POA: Insufficient documentation

## 2020-12-25 DIAGNOSIS — R112 Nausea with vomiting, unspecified: Secondary | ICD-10-CM

## 2020-12-25 LAB — CBC WITH DIFFERENTIAL/PLATELET
Abs Immature Granulocytes: 0.03 10*3/uL (ref 0.00–0.07)
Basophils Absolute: 0.1 10*3/uL (ref 0.0–0.1)
Basophils Relative: 1 %
Eosinophils Absolute: 0.3 10*3/uL (ref 0.0–0.5)
Eosinophils Relative: 4 %
HCT: 44.3 % (ref 39.0–52.0)
Hemoglobin: 15.2 g/dL (ref 13.0–17.0)
Immature Granulocytes: 0 %
Lymphocytes Relative: 21 %
Lymphs Abs: 1.8 10*3/uL (ref 0.7–4.0)
MCH: 30.6 pg (ref 26.0–34.0)
MCHC: 34.3 g/dL (ref 30.0–36.0)
MCV: 89.3 fL (ref 80.0–100.0)
Monocytes Absolute: 0.6 10*3/uL (ref 0.1–1.0)
Monocytes Relative: 7 %
Neutro Abs: 5.6 10*3/uL (ref 1.7–7.7)
Neutrophils Relative %: 67 %
Platelets: 310 10*3/uL (ref 150–400)
RBC: 4.96 MIL/uL (ref 4.22–5.81)
RDW: 12.1 % (ref 11.5–15.5)
WBC: 8.5 10*3/uL (ref 4.0–10.5)
nRBC: 0 % (ref 0.0–0.2)

## 2020-12-25 LAB — COMPREHENSIVE METABOLIC PANEL
ALT: 9 U/L (ref 0–44)
AST: 14 U/L — ABNORMAL LOW (ref 15–41)
Albumin: 3.9 g/dL (ref 3.5–5.0)
Alkaline Phosphatase: 52 U/L (ref 38–126)
Anion gap: 9 (ref 5–15)
BUN: 12 mg/dL (ref 6–20)
CO2: 27 mmol/L (ref 22–32)
Calcium: 9.2 mg/dL (ref 8.9–10.3)
Chloride: 102 mmol/L (ref 98–111)
Creatinine, Ser: 0.89 mg/dL (ref 0.61–1.24)
GFR, Estimated: >60 mL/min (ref >60)
Glucose, Bld: 104 mg/dL — ABNORMAL HIGH (ref 70–99)
Potassium: 3.5 mmol/L (ref 3.5–5.1)
Sodium: 138 mmol/L (ref 135–145)
Total Bilirubin: 0.9 mg/dL (ref 0.3–1.2)
Total Protein: 7.9 g/dL (ref 6.5–8.1)

## 2020-12-25 LAB — URINALYSIS, COMPLETE (UACMP) WITH MICROSCOPIC
Bacteria, UA: NONE SEEN
Bilirubin Urine: NEGATIVE
Glucose, UA: NEGATIVE mg/dL
Hgb urine dipstick: NEGATIVE
Ketones, ur: NEGATIVE mg/dL
Leukocytes,Ua: NEGATIVE
Nitrite: NEGATIVE
Protein, ur: NEGATIVE mg/dL
Specific Gravity, Urine: >1.046 — ABNORMAL HIGH (ref 1.005–1.030)
pH: 5 (ref 5.0–8.0)

## 2020-12-25 LAB — RESP PANEL BY RT-PCR (FLU A&B, COVID) ARPGX2
Influenza A by PCR: NEGATIVE
Influenza B by PCR: NEGATIVE
SARS Coronavirus 2 by RT PCR: NEGATIVE

## 2020-12-25 LAB — LIPASE, BLOOD: Lipase: 22 U/L (ref 11–51)

## 2020-12-25 MED ORDER — AMOXICILLIN-POT CLAVULANATE 875-125 MG PO TABS: 1.0000 | ORAL_TABLET | Freq: Two times a day (BID) | ORAL | 0 refills | Status: AC

## 2020-12-25 MED ORDER — ALBUTEROL SULFATE HFA 108 (90 BASE) MCG/ACT IN AERS
4.0000 | INHALATION_SPRAY | RESPIRATORY_TRACT | Status: AC
  Administered 2020-12-25: 4 via RESPIRATORY_TRACT
  Filled 2020-12-25 (×2): qty 6.7

## 2020-12-25 MED ORDER — AMOXICILLIN-POT CLAVULANATE 875-125 MG PO TABS
1.0000 | ORAL_TABLET | Freq: Once | ORAL | Status: AC
  Administered 2020-12-25: 1 via ORAL
  Filled 2020-12-25: qty 1

## 2020-12-25 MED ORDER — SODIUM CHLORIDE 0.9 % IV BOLUS
1000.0000 mL | Freq: Once | INTRAVENOUS | Status: AC
  Administered 2020-12-25: 1000 mL via INTRAVENOUS

## 2020-12-25 MED ORDER — IOHEXOL 300 MG/ML  SOLN
100.0000 mL | Freq: Once | INTRAMUSCULAR | Status: AC | PRN
  Administered 2020-12-25: 100 mL via INTRAVENOUS

## 2020-12-25 MED ORDER — AZITHROMYCIN 250 MG PO TABS: ORAL_TABLET | ORAL | 0 refills | Status: AC

## 2020-12-25 MED ORDER — PREDNISONE 50 MG PO TABS: ORAL_TABLET | ORAL | 0 refills | Status: AC

## 2020-12-25 MED ORDER — MORPHINE SULFATE (PF) 4 MG/ML IV SOLN
4.0000 mg | Freq: Once | INTRAVENOUS | Status: AC
  Administered 2020-12-25: 4 mg via INTRAVENOUS
  Filled 2020-12-25: qty 1

## 2020-12-25 MED ORDER — ONDANSETRON 4 MG PO TBDP: 4.0000 mg | ORAL_TABLET | Freq: Four times a day (QID) | ORAL | 0 refills | Status: AC | PRN

## 2020-12-25 MED ORDER — KETOROLAC TROMETHAMINE 30 MG/ML IJ SOLN
15.0000 mg | Freq: Once | INTRAMUSCULAR | Status: AC
  Administered 2020-12-25: 15 mg via INTRAVENOUS
  Filled 2020-12-25: qty 1

## 2020-12-25 MED ORDER — ONDANSETRON HCL 4 MG/2ML IJ SOLN
4.0000 mg | INTRAMUSCULAR | Status: AC
  Administered 2020-12-25: 4 mg via INTRAVENOUS
  Filled 2020-12-25: qty 2

## 2020-12-25 NOTE — Consult Note (Addendum)
[facility] SURGICAL CONSULTATION NOTE (initial) - cpt: 09983   HISTORY OF PRESENT ILLNESS (HPI):  40 y.o. male presented to [facility] ED today for evaluation of abdominal pain. Patient reports around a 2-3 days history of inability to tolerate PO intake secondary to nausea, emesis, and diarrhea. He is also endorsing discomfort in his LLQ over this time, but he is more concerned about emesis and diarrhea. He denied any abnormal food intake or exposures prior to the onset of symptoms. No fever, chills, CP, or urinary changes. No history of similar in the past. No previous intra-abdominal surgeries. Laboratory work up was completely normal, WBC 8.5K. He did have CT Abdomen/Pelvis which did show large gallstone centrally in dilated gallbladder but no evidence of cholecystitis. RUQ [place] confirmed the same.   Surgery is consulted by emergency medicine physician Dr. Sharyn Creamer, MD in this context for evaluation and management of cholelithiasis without cholecystitis in setting of LLQ pain, nausea, emesis.  PAST MEDICAL HISTORY (PMH):  Past Medical History:  Diagnosis Date   MRSA cellulitis      PAST SURGICAL HISTORY (PSH):  Past Surgical History:  Procedure Laterality Date   HAND SURGERY Left    thumb     MEDICATIONS:  Prior to Admission medications   Medication Sig Start Date End Date Taking? Authorizing Provider  amoxicillin-clavulanate (AUGMENTIN) 875-125 MG tablet Take 1 tablet by mouth 2 (two) times daily. 12/25/20  Yes Sharyn Creamer, MD  azithromycin (ZITHROMAX) 250 MG tablet 1 tab by mouth daily 12/26/20  Yes Sharyn Creamer, MD  ondansetron (ZOFRAN ODT) 4 MG disintegrating tablet Take 1 tablet (4 mg total) by mouth every 6 (six) hours as needed for nausea or vomiting. 12/25/20  Yes Sharyn Creamer, MD  predniSONE (DELTASONE) 50 MG tablet 1 tab daily 12/25/20  Yes Sharyn Creamer, MD     ALLERGIES:  Allergies  Allergen Reactions   Bactrim [Sulfamethoxazole-Trimethoprim] Anaphylaxis    Clindamycin/Lincomycin Rash   Codeine Nausea And Vomiting   Hydrocodone Hives     SOCIAL HISTORY:  Social History   Socioeconomic History   Marital status: Married    Spouse name: Not on file   Number of children: Not on file   Years of education: Not on file   Highest education level: Not on file  Occupational History   Not on file  Tobacco Use   Smoking status: Every Day    Packs/day: 1.00    Pack years: 0.00    Types: Cigarettes   Smokeless tobacco: Never  Vaping Use   Vaping Use: Never used  Substance and Sexual Activity   Alcohol use: No   Drug use: No   Sexual activity: Not on file  Other Topics Concern   Not on file  Social History Narrative   Not on file   Social Determinants of Health   Financial Resource Strain: Not on file  Food Insecurity: Not on file  Transportation Needs: Not on file  Physical Activity: Not on file  Stress: Not on file  Social Connections: Not on file  Intimate Partner Violence: Not on file     FAMILY HISTORY:  No family history on file.    REVIEW OF SYSTEMS:  Review of Systems  Constitutional:  Negative for chills and fever.  HENT:  Negative for congestion and sore throat.   Cardiovascular:  Negative for chest pain and palpitations.  Gastrointestinal:  Positive for abdominal pain, diarrhea, nausea and vomiting. Negative for blood in stool, constipation and melena.  Genitourinary:  Negative for dysuria and urgency.  All other systems reviewed and are negative.  VITAL SIGNS:  Temp:  [98.9 F (37.2 C)] 98.9 F (37.2 C) (07/01 0730) Pulse Rate:  [46-59] 46 (07/01 1323) Resp:  [12-22] 19 (07/01 1323) BP: (116-140)/(80-86) 123/83 (07/01 1323) SpO2:  [92 %-100 %] 92 % (07/01 1323) Weight:  [90.7 kg] 90.7 kg (07/01 0734)     Height: 6' (182.9 cm) Weight: 90.7 kg BMI (Calculated): 27.12   INTAKE/OUTPUT:  No intake/output data recorded.  PHYSICAL EXAM:  Physical Exam Vitals and nursing note reviewed. Exam conducted with a  chaperone present.  Constitutional:      General: He is not in acute distress.    Appearance: He is well-developed. He is obese. He is not ill-appearing.  HENT:     Head: Normocephalic and atraumatic.  Cardiovascular:     Rate and Rhythm: Normal rate.     Heart sounds: Normal heart sounds. No murmur heard. Pulmonary:     Effort: Pulmonary effort is normal. No respiratory distress.  Abdominal:     General: Abdomen is protuberant. There is no distension.     Palpations: Abdomen is soft.     Tenderness: There is abdominal tenderness in the left lower quadrant. There is no guarding or rebound. Negative signs include Murphy's sign and McBurney's sign.     Comments: Abdomen is protuberant consistent with degree of obesity, soft, he is tender in the LLQ, no RUQ tenderness, negative Murphy's Sign, no rebound/guarding, no evidence of peritonitis   Genitourinary:    Comments: Deferred Skin:    General: Skin is warm and dry.     Coloration: Skin is not jaundiced or pale.  Neurological:     General: No focal deficit present.     Mental Status: He is alert and oriented to person, place, and time.  Psychiatric:        Mood and Affect: Mood normal.        Behavior: Behavior normal.     Labs:  CBC Latest Ref Rng & Units 12/25/2020 03/12/2018 02/14/2016  WBC 4.0 - 10.5 K/uL 8.5 7.6 7.7  Hemoglobin 13.0 - 17.0 g/dL 16.1 09.6 04.5  Hematocrit 39.0 - 52.0 % 44.3 39.9(L) 40.1  Platelets 150 - 400 K/uL 310 247 196   CMP Latest Ref Rng & Units 12/25/2020 03/12/2018 02/14/2016  Glucose 70 - 99 mg/dL 409(W) 119(J) 478(G)  BUN 6 - 20 mg/dL 12 13 8   Creatinine 0.61 - 1.24 mg/dL 9.56 2.13  Sodium 135 - 145 mmol/L 138 133(L) 138  Potassium 3.5 - 5.1 mmol/L 3.5 3.3(L) 3.5  Chloride 98 - 111 mmol/L 102 100 104  CO2 22 - 32 mmol/L 27 25 29   Calcium 8.9 - 10.3 mg/dL 9.2 9.2 0.86)  Total Protein 6.5 - 8.1 g/dL 7.9 7.3 -  Total Bilirubin 0.3 - 1.2 mg/dL 0.9 0.7 -  Alkaline Phos 38 - 126 U/L 52 47 -  AST  15 - 41 U/L 14(L) 35 -  ALT 0 - 44 U/L 9 29 -     Imaging studies:   CT Abdomen/Pelvis (12/25/2020) personally reviewed which does show a distended gallbladder with a single giant stone in the middle of the gallbladder, no surrounding inflammation, otherwise without any intra-abdominal finding to explain LLQ pain. He does have some diverticulosis but certainly no diverticulitis, colitis. No free air or pneumoperitoneum. Radiologist report reviewed below:  IMPRESSION: Cholelithiasis without complicating factors.   Patchy airspace opacity in the left base consistent  with acute infiltrate. Correlate with pending respiratory panel.   Mild inflammatory changes centrally within the mesentery which may represent some mild mesenteric adenitis.   No other focal abnormality is noted.   RUQ [place] (12/25/2020) personally reviewed with again cholelithiasis without cholecystitis, and radiologist report  IMPRESSION: 1. Gallstone. Gallbladder wall thickness is upper limits of normal measuring between 2.5 and 3 mm. No pericholecystic fluid or sonographic Murphy's sign. Correlate for any clinical signs or symptoms of early cholecystitis.   Assessment/Plan: (ICD-10's: R10.32) 40 y.o. male presenting with LLQ abdominal pain of unclear etiology incidentally found to have large gallstone without changes consistent with cholecystitis nor with any physical examination findings concerning for similar. He is completely pain free in the RUQ with negative Murphy's Sign. Unsure how to explain LLQ pain. On imaging he does have a mild degree of diverticulosis without any evidence of diverticulitis, colitis, nor enteritis. There is certainly no free air or abscess. This could certainly be mesenteric adenitis as mentioned in CT report. His laboratory work up was also grossly normal without leukocytosis. I do not think he warrants any surgical intervention at this time. Discussed case with EDP who plans for discharge with  Abx and close follow up for re-check.   All of the above findings and recommendations were discussed with the patient, and all of patient's questions were answered to their expressed satisfaction.  Thank you for the opportunity to participate in this patient's care.   -- Lynden Oxford, PA-C [facility] 12/25/2020, 2:46 PM [phone] M-F: 7am - 4pm  Patient discharged from ED prior to my evaluation. I personally reviewed his imaging and labs and discussed his case in detail w/Mr. Manus Rudd.

## 2020-12-25 NOTE — ED Triage Notes (Signed)
Pt to ED via [facility] from home. Pt c/o N/V/D and unable to sleep x3 days. Pt stating, SOB, right sided face pain and abdominal pain. VSS with EMS.   Pt upon arrival diaphoretic with labored breathing. Pt endorses SOB, light sensitivity and dizziness. Pt denies CP. Pt A&Ox4

## 2020-12-25 NOTE — ED Provider Notes (Signed)
[facility] Emergency Department Provider Note ____________________________________________   Event Date/Time   First MD Initiated Contact with Patient 12/25/20 [phone]     (approximate)  I have reviewed the triage vital signs and the nursing notes.  HISTORY  Chief Complaint Abdominal Pain (N/V/D x3days)  HPI John Riddle is a 40 y.o. male previous history of cellulitis  For 3 days now patient reports he has been throwing up not eating or drinking well.  He has been having loose stools also associated with lower abdominal pain.  Feels like his abdomen is sore, but main concern is vomiting and diarrhea.  No black or bloody stool.  Works outside but no known tick or insect bites.   Smokes. Does not use alcohol.  Is also noticed a slight dry cough today, this seems to start in the last day.  Reports his eyes also feel little bit red or itchy.  No rash.  Slight body aches.   Past Medical History:  Diagnosis Date   MRSA cellulitis     There are no problems to display for this patient.   Past Surgical History:  Procedure Laterality Date   HAND SURGERY Left    thumb    Prior to Admission medications   Medication Sig Start Date End Date Taking? Authorizing Provider  amoxicillin-clavulanate (AUGMENTIN) 875-125 MG tablet Take 1 tablet by mouth 2 (two) times daily. 12/25/20  Yes Sharyn Creamer, MD  azithromycin (ZITHROMAX) 250 MG tablet 1 tab by mouth daily 12/26/20  Yes Sharyn Creamer, MD  ondansetron (ZOFRAN ODT) 4 MG disintegrating tablet Take 1 tablet (4 mg total) by mouth every 6 (six) hours as needed for nausea or vomiting. 12/25/20  Yes Sharyn Creamer, MD  predniSONE (DELTASONE) 50 MG tablet 1 tab daily 12/25/20  Yes Sharyn Creamer, MD  Patient reports not actually on any prescriptions right now  Allergies Bactrim [sulfamethoxazole-trimethoprim], Clindamycin/lincomycin, Codeine, and Hydrocodone  No family history on file.  Social History Social History   Tobacco  Use   Smoking status: Every Day    Packs/day: 1.00    Pack years: 0.00    Types: Cigarettes   Smokeless tobacco: Never  Vaping Use   Vaping Use: Never used  Substance Use Topics   Alcohol use: No   Drug use: No    Review of Systems Constitutional: No fever/chills but is feeling fatigued Eyes: No visual changes except this morning noticed his eyes seem a little red. ENT: No sore throat. Cardiovascular: Denies chest pain. Respiratory: Dry cough since this morning.  Has not noticed any wheezing.  No shortness of breath. Gastrointestinal: See HPI Genitourinary: Negative for dysuria. Musculoskeletal: Negative for back pain. Skin: Negative for rash. Neurological: Negative for headaches except occasionally having a slight headache over the right side of his face off and on the last couple days, areas of focal weakness or numbness.  No neck pain or stiffness.    ____________________________________________   PHYSICAL EXAM:  VITAL SIGNS: ED Triage Vitals  Enc Vitals Group     BP 12/25/20 0730 116/86     Pulse Rate 12/25/20 0730 (!) 59     Resp 12/25/20 0730 18     Temp 12/25/20 0730 98.9 F (37.2 C)     Temp Source 12/25/20 0730 Oral     SpO2 12/25/20 0730 97 %     Weight 12/25/20 0734 200 lb (90.7 kg)     Height 12/25/20 0734 6' (1.829 m)     Head Circumference --  Peak Flow --      Pain Score 12/25/20 0732 10     Pain Loc --      Pain Edu? --      Excl. in GC? --     Constitutional: Alert and oriented.  Mildly ill-appearing, slightly diaphoretic but in no acute distress or extremis.  He is very pleasant. Eyes: Conjunctivae are normal. Head: Atraumatic. Nose: No congestion/rhinnorhea. Mouth/Throat: Mucous membranes are dry Neck: No stridor.  Cardiovascular: Normal rate, regular rhythm. Grossly normal heart sounds.  Good peripheral circulation. Respiratory: Normal respiratory effort.  No retractions. Lungs CTAB except from slight amount of wheezing with  expiration noted across the right upper chest.  Patient does also exhibit occasional dry cough. Gastrointestinal: Abdomen soft nondistended.  He has moderate tenderness suprapubically and also [place] and somewhat more focal increased tenderness along the left lower abdomen.  There is no guarding.  Minimal pain in the right lower quadrant, seems to be more left lower suprapubic.  No upper abdominal or epigastric pain. Musculoskeletal: No lower extremity tenderness nor edema.  No rashes on the extremities. Neurologic:  Normal speech and language. No gross focal neurologic deficits are appreciated.  Skin:  Skin is warm, just slightly diaphoretic on forehead and intact. No rash noted. Psychiatric: Mood and affect are normal. Speech and behavior are normal.  ____________________________________________   LABS (all labs ordered are listed, but only abnormal results are displayed)  Labs Reviewed  COMPREHENSIVE METABOLIC PANEL - Abnormal; Notable for the following components:      Result Value   Glucose, Bld 104 (*)    AST 14 (*)    All other components within normal limits  URINALYSIS, COMPLETE (UACMP) WITH MICROSCOPIC - Abnormal; Notable for the following components:   Color, Urine YELLOW (*)    APPearance CLEAR (*)    Specific Gravity, Urine >1.046 (*)    All other components within normal limits  RESP PANEL BY RT-PCR (FLU A&B, COVID) ARPGX2  LIPASE, BLOOD  CBC WITH DIFFERENTIAL/PLATELET  ROCKY MTN SPOTTED FVR ABS PNL(IGG+IGM)   ____________________________________________  EKG  Reviewed interpreted 735 Heart rate 50 QRS 100 QTc 430 Normal sinus rhythm, no evidence of acute ischemia or ectopy. ____________________________________________  RADIOLOGY  DG Chest 2 View  Result Date: 12/25/2020 CLINICAL DATA:  Cough. EXAM: CHEST - 2 VIEW COMPARISON:  January 12, 2015. FINDINGS: The heart size and mediastinal contours are within normal limits. Both lungs are clear. The visualized  skeletal structures are unremarkable. IMPRESSION: No active cardiopulmonary disease. Electronically Signed   By: Lupita RaiderJames  Green Jr M.D.   On: 12/25/2020 08:48   CT ABDOMEN PELVIS W CONTRAST  Result Date: 12/25/2020 CLINICAL DATA:  Nausea and vomiting and left abdominal pain EXAM: CT ABDOMEN AND PELVIS WITH CONTRAST TECHNIQUE: Multidetector CT imaging of the abdomen and pelvis was performed using the standard protocol following bolus administration of intravenous contrast. CONTRAST:  100mL OMNIPAQUE IOHEXOL 300 MG/ML  SOLN COMPARISON:  03/12/2018 FINDINGS: Lower chest: Lung bases demonstrate patchy ground-glass airspace opacity in the left lower lobe most consistent with early infiltrate. Hepatobiliary: Liver is within normal limits. Gallbladder is well distended with a single dependent gallstone. No wall thickening or pericholecystic fluid is noted. Pancreas: Unremarkable. No pancreatic ductal dilatation or surrounding inflammatory changes. Spleen: Normal in size without focal abnormality. Adrenals/Urinary Tract: Adrenal glands are within normal limits. Kidneys are well visualize without evidence of renal calculi or urinary tract obstructive changes. Stable cyst is noted within the left kidney. No obstructive changes  are noted. The bladder is partially distended. Stomach/Bowel: The appendix is well visualized and within normal limits. No obstructive or inflammatory changes of the colon are seen. Small bowel is well visualized and within normal limits. Stomach is unremarkable. Vascular/Lymphatic: No significant vascular findings are present. No enlarged abdominal or pelvic lymph nodes. Reproductive: Prostate is unremarkable. Other: No free fluid is noted. Minimal inflammatory changes are noted centrally within the mesentery best seen on images 60 through 67 of series number 2. this is of uncertain etiology/significance. It may represent some very mild mesenteric adenitis. Musculoskeletal: No acute or significant  osseous findings. IMPRESSION: Cholelithiasis without complicating factors. Patchy airspace opacity in the left base consistent with acute infiltrate. Correlate with pending respiratory panel. Mild inflammatory changes centrally within the mesentery which may represent some mild mesenteric adenitis. No other focal abnormality is noted. Electronically Signed   By: Alcide Clever M.D.   On: 12/25/2020 09:28   US ABDOMEN LIMITED RUQ (LIVER/GB)  Result Date: 12/25/2020 CLINICAL DATA:  Abdominal pain. EXAM: ULTRASOUND ABDOMEN LIMITED RIGHT UPPER QUADRANT COMPARISON:  CT AP 12/25/2020 FINDINGS: Gallbladder: There is a stone within the gallbladder measuring 2.5 cm. Gallbladder wall thickness is upper limits of normal measuring between 2.5 and 3.0 mm. No pericholecystic fluid. Negative sonographic Murphy's sign. Common bile duct: Diameter: 3 mm. Liver: No focal lesion identified. Within normal limits in parenchymal echogenicity. Portal vein is patent on color Doppler imaging with normal direction of blood flow towards the liver. Other: None. IMPRESSION: 1. Gallstone. Gallbladder wall thickness is upper limits of normal measuring between 2.5 and 3 mm. No pericholecystic fluid or sonographic Murphy's sign. Correlate for any clinical signs or symptoms of early cholecystitis. Electronically Signed   By: Signa Kell M.D.   On: 12/25/2020 10:48    Imaging reviewed by me, CT abdomen notable for patchy airspace disease in the left lung base.  More given the patient's cough that is developed last year to this could represent early community-acquired pneumonia.  He is also a smoker.  Possible mesenteric adenitis also noticed.  Abdominal ultrasound reviewed, negative for cholecystitis, but gallstone is present.  Patient was seen and evaluated by general surgery in the ER who felt no evidence of acute cholecystitis or acute surgical process of the  abdomen ____________________________________________   PROCEDURES  Procedure(s) performed: None  Procedures  Critical Care performed: No  ____________________________________________   INITIAL IMPRESSION / ASSESSMENT AND PLAN / ED COURSE  Pertinent labs & imaging results that were available during my care of the patient were reviewed by me and considered in my medical decision making (see chart for details).   Patient presents 3 days nausea vomiting and loose watery diarrhea associated now with abdominal pain primarily suprapubic left lower quadrant.  Await labs, CT imaging.  No evidence of acute abdomen by exam, but given the focality of discomfort in his associated symptoms certainly do wish to evaluate and exclude etiology such as colitis diverticulitis appendicitis, pancreatitis, pyelonephritis etc.  He also reports a dry cough and watery eyes this morning, this seems to increase by thought that this may be viral in etiology, and will check COVID and flu.  Works outside no obvious tick bites, and his symptoms seem somewhat atypical of tickborne illness.  Send RMFS titer  Additionally slight wheezing dry cough, will treat with albuterol.  Denies history of COPD or asthma.  No respiratory distress or hypoxia.  Suspect likely some sort of bronchospasm related to his acute illness. Clinical Course as of  12/25/20 1437  Fri Dec 25, 2020  1303 Patient updated on results of work-up to this point.  At this point, I have placed a consult to general surgery as the patient continues to endorse abdominal pain moderate, though primarily seems to be in the lower abdomen, he does report some discomfort to palpation the right upper quadrant where he has known gallstone.  Additional morphine as well as Toradol ordered for pain relief.  Patient awake alert fully oriented, understand agreeable with plan for consult [MQ]    Clinical Course User Index [MQ] Sharyn Creamer, MD    ----------------------------------------- 2:37 PM on 12/25/2020 ----------------------------------------- Patient resting comfortably, lung sounds are now clear.  Reports he does use albuterol inhaler as needed at home he has a recent prescription available there.  Also agreeable to taking 5 days of prednisone for what appears to be mild community-acquired pneumonia and I suspect also a associated viral process which may be causing his nausea abdominal discomfort, cough as well.  We will treat with antibiotics, sent to the pharmacy as he requested.  Careful return precautions advised as well as recommendation of follow-up with general surgery and precautions around both pneumonia and abdominal pain.  Patient in agreement  Return precautions and treatment recommendations and follow-up discussed with the patient who is agreeable with the plan.   ____________________________________________   FINAL CLINICAL IMPRESSION(S) / ED DIAGNOSES  Final diagnoses:  Abdominal pain  Community acquired pneumonia of left lower lobe of lung  Non-intractable vomiting with nausea, unspecified vomiting type  Calculus of gallbladder without cholecystitis without obstruction        Note:  This document was prepared using Dragon voice recognition software and may include unintentional dictation errors       Sharyn Creamer, MD 12/25/20 1438

## 2021-01-06 LAB — ROCKY MTN SPOTTED FVR ABS PNL(IGG+IGM)
RMSF IgG: POSITIVE — AB
RMSF IgM: 0.38 index (ref 0.00–0.89)

## 2021-01-06 LAB — RMSF, IGG, IFA: RMSF, IGG, IFA: 1:64 {titer} — ABNORMAL HIGH

## 2021-01-07 ENCOUNTER — Telehealth: Payer: Self-pay | Admitting: Emergency Medicine

## 2021-01-07 NOTE — Telephone Encounter (Addendum)
Called to inform patient of rmsf results and need to take doxycyline 100 mg twice daily for 10 days (per dr Loura Pardon)  Wife answered and says patient does not have separate phone and will not be back until after 5pm.  I told her he can call back for explanation as I will put a note inhis chart.  It told her that I would be calling in some medication to [facility].  She will let him know Doxycycline called [facility].

## 2022-09-11 IMAGING — CR DG CHEST 2V
1 series · 2 of 2 positions shown · non-contrast
Comparison: January 12, 2015.

CLINICAL DATA: Cough.

EXAM:
CHEST - 2 VIEW

[Series 1: dg chest 2 view · 0.14mm/px · 2 of 2 slices shown]
[im 1/2]
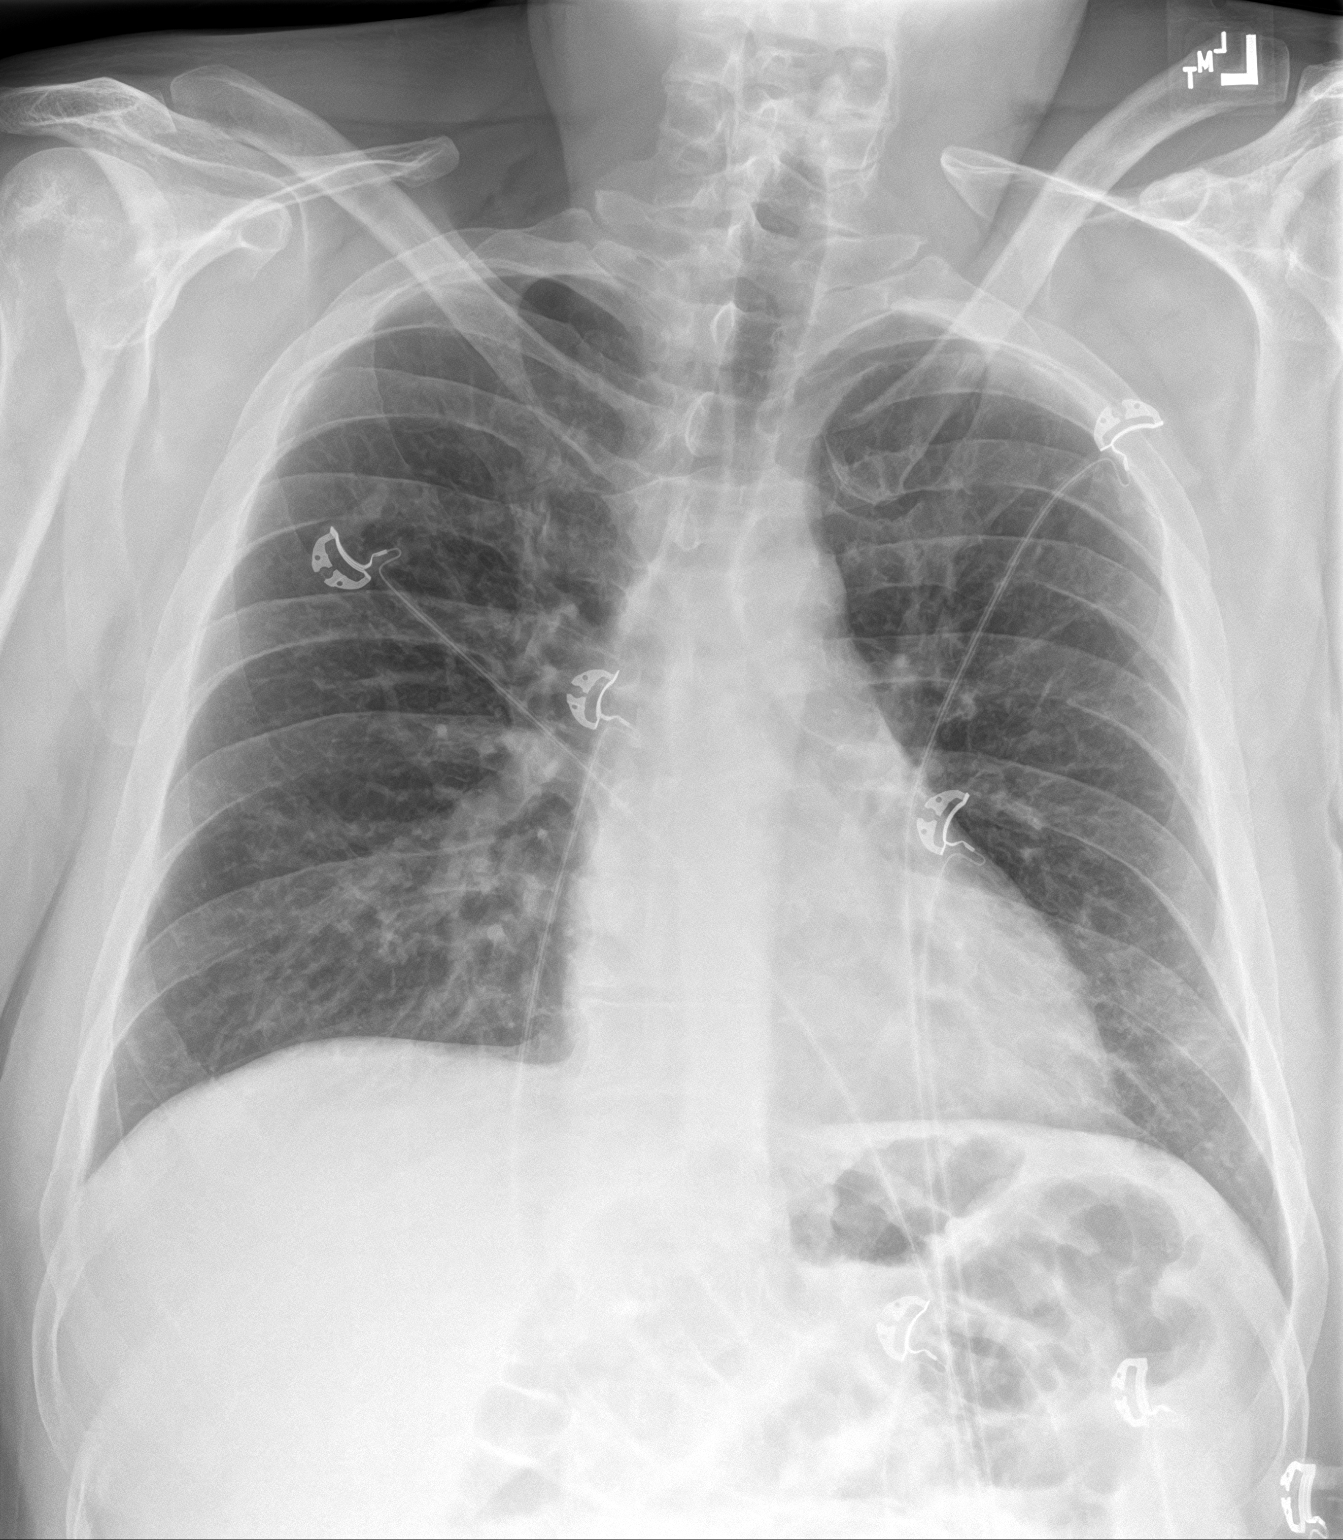
[im 2/2]
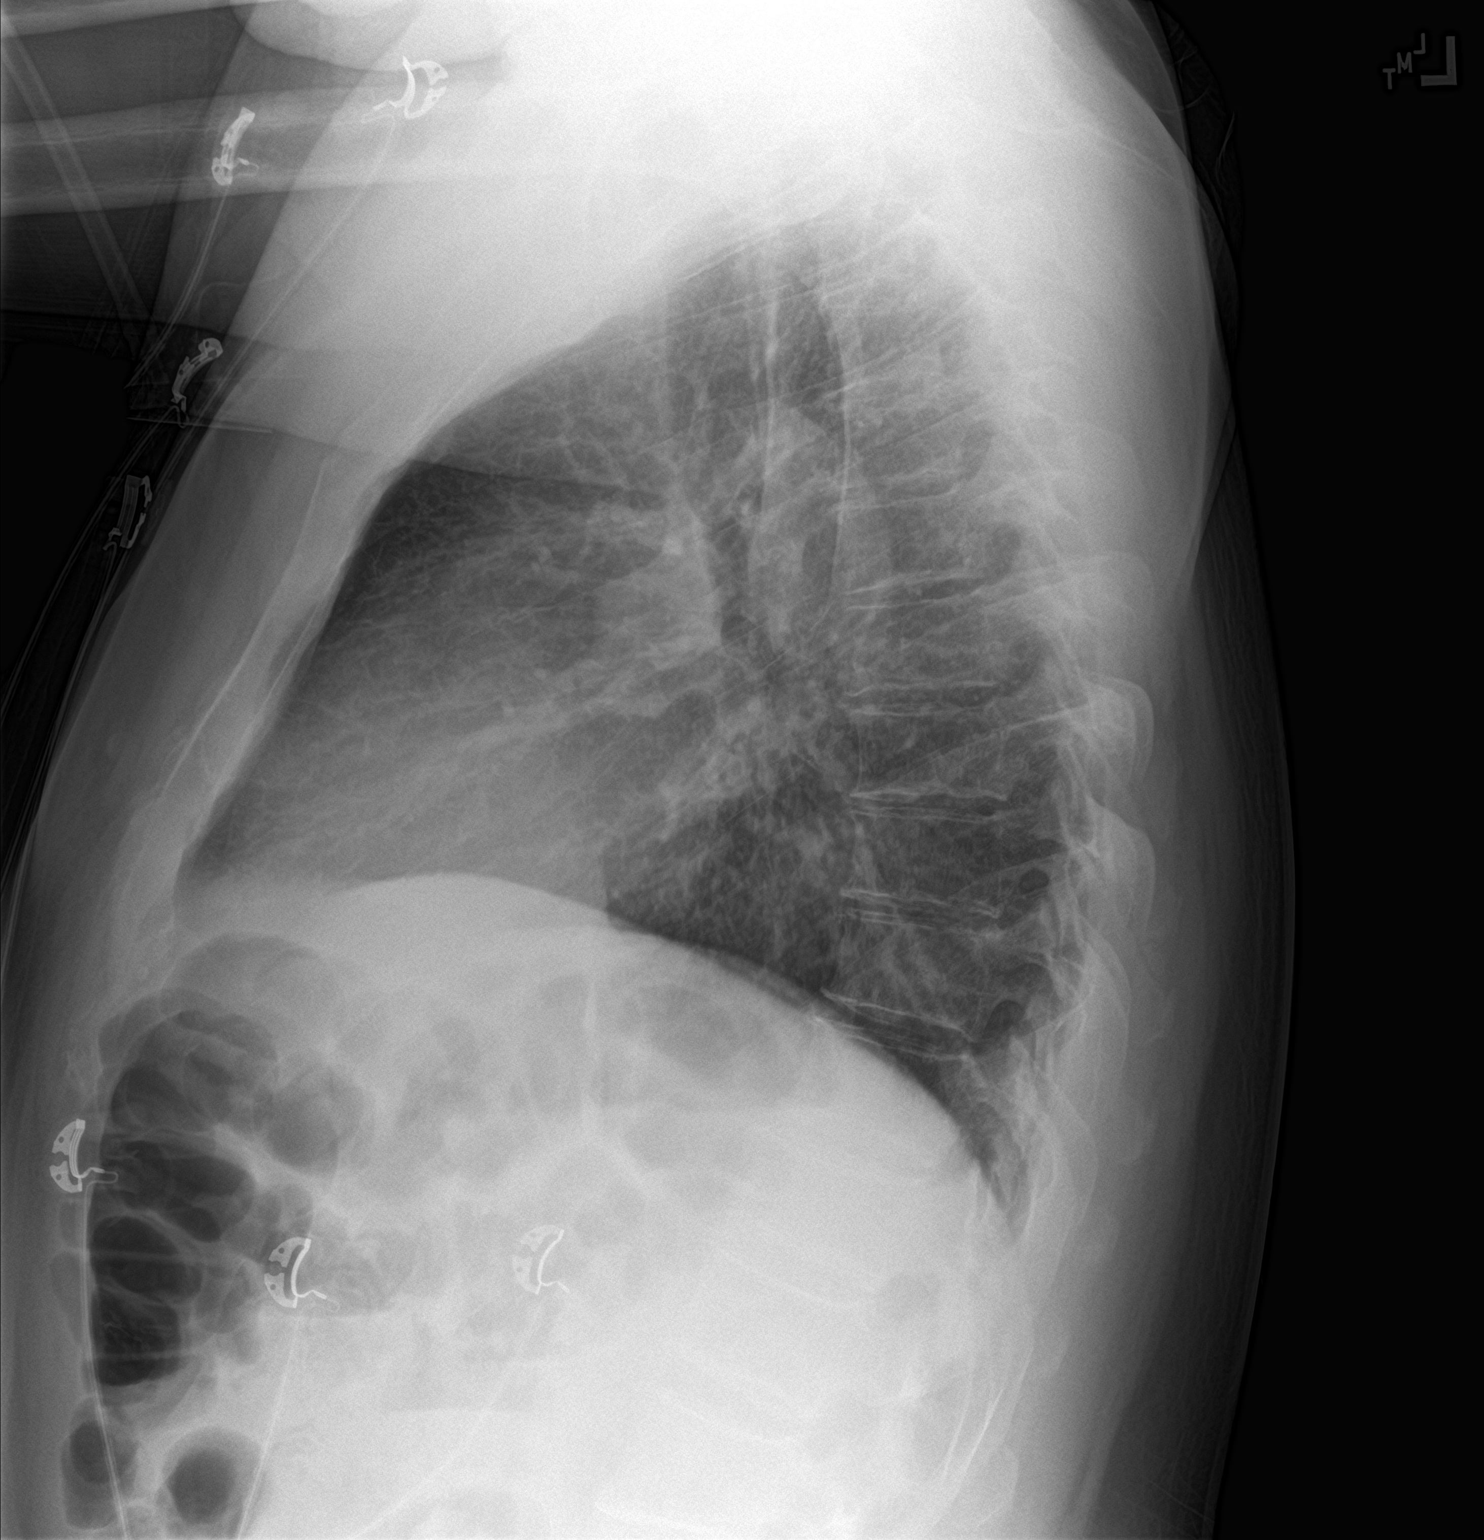

[2 of 2 positions shown; findings below may reference images not displayed]

FINDINGS: The heart size and mediastinal contours are within normal limits.
Both lungs are clear. The visualized skeletal structures are
unremarkable.
IMPRESSION: No active cardiopulmonary disease.

## 2022-09-11 IMAGING — US US ABDOMEN LIMITED
1 series · 15 of 25 positions shown · non-contrast
Comparison: CT AP 12/25/2020

CLINICAL DATA: Abdominal pain.

EXAM:
ULTRASOUND ABDOMEN LIMITED RIGHT UPPER QUADRANT

[Series 1: us abdomen limited ruq · 15 of 32 slices shown]
[im 1/32]
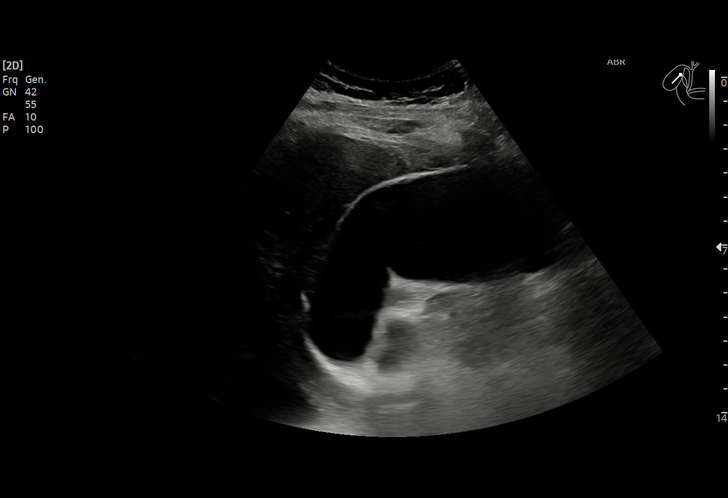
[im 3/32]
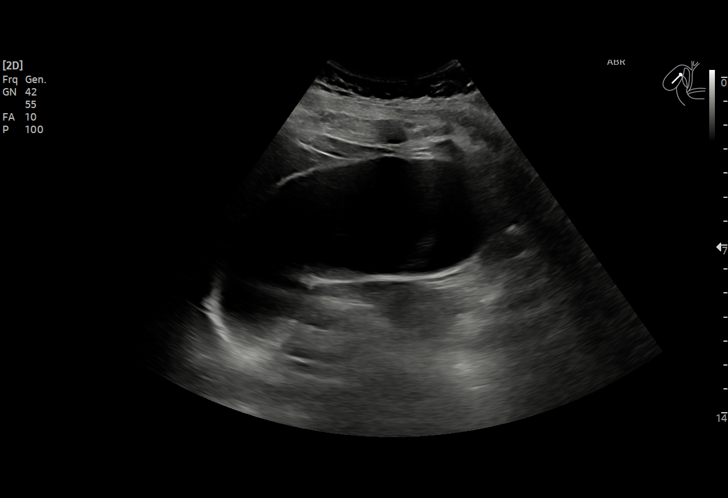
[im 6/32]
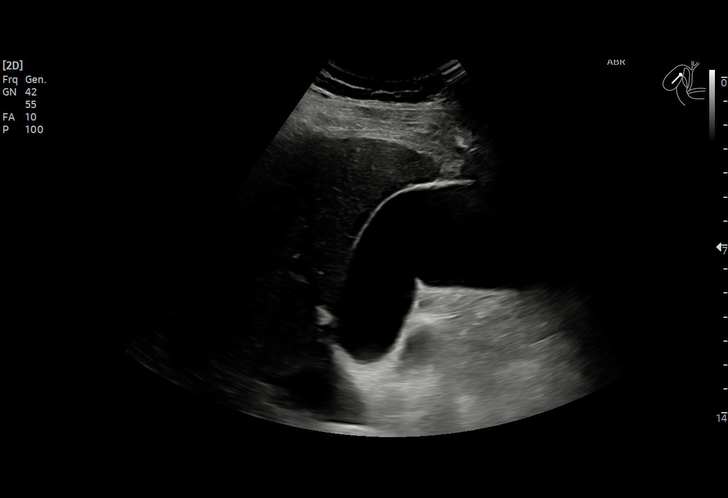
[im 7/32]
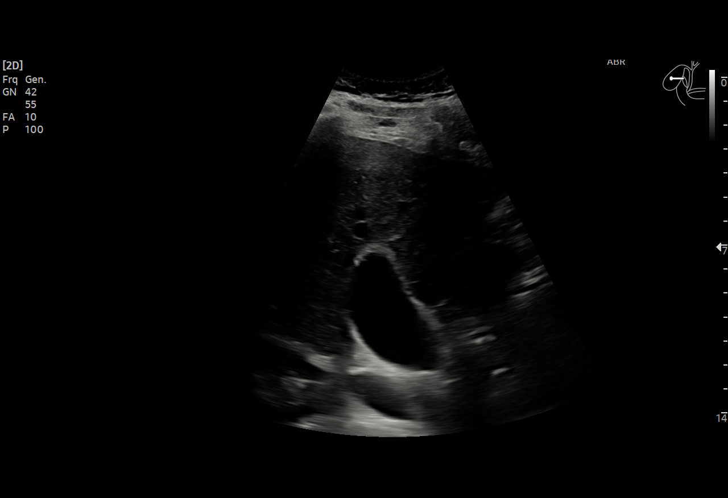
[im 10/32]
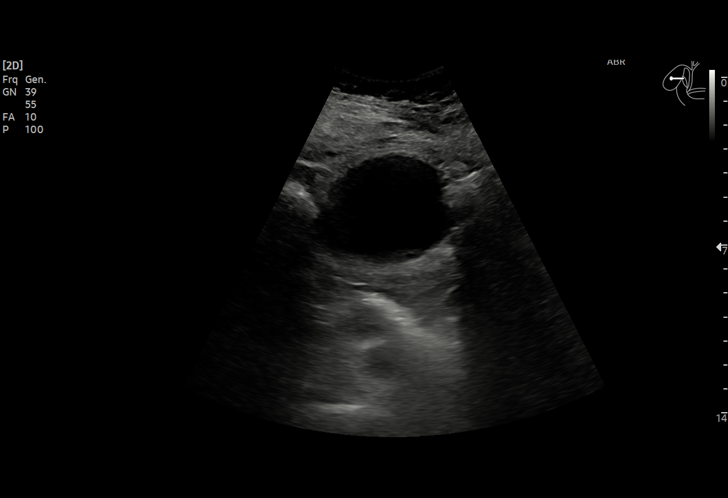
[im 12/32]
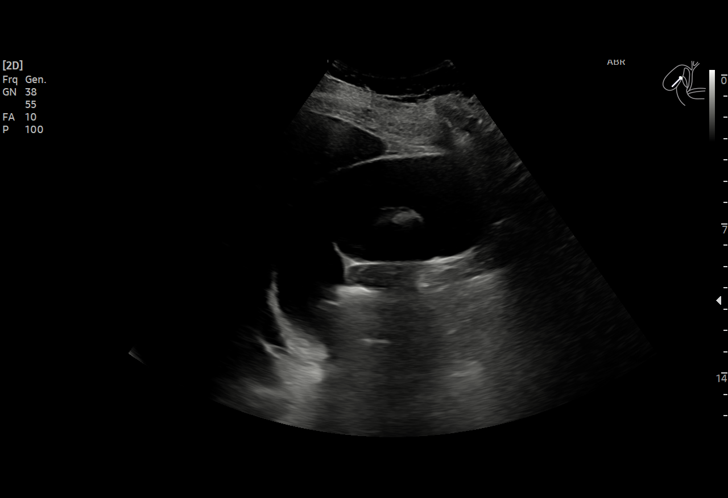
[im 13/32]
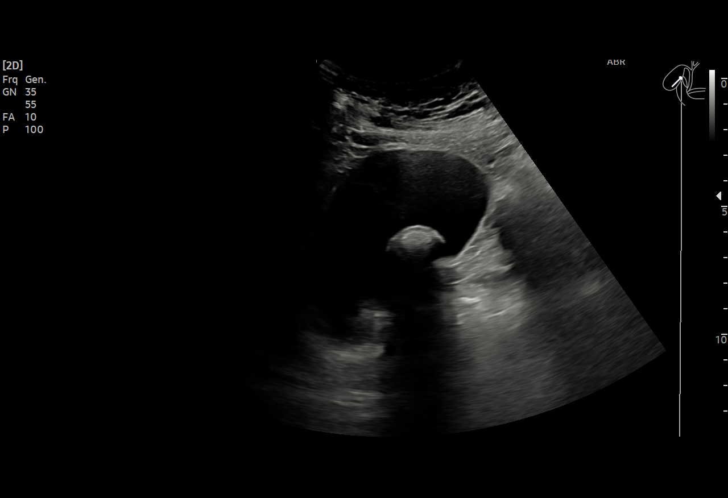
[im 16/32]
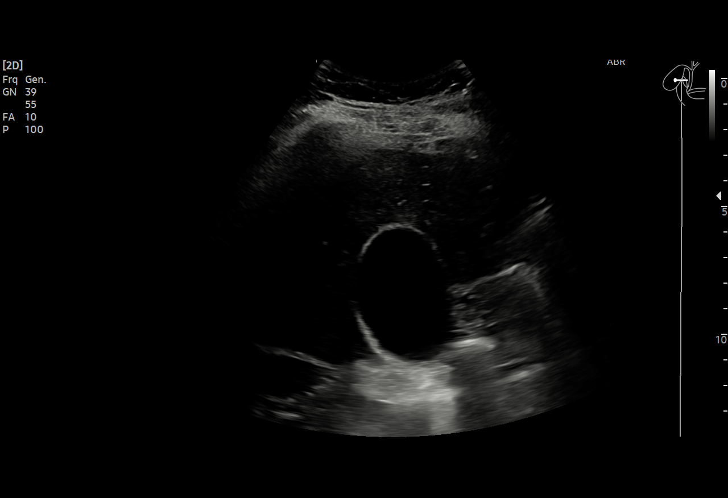
[im 19/32]
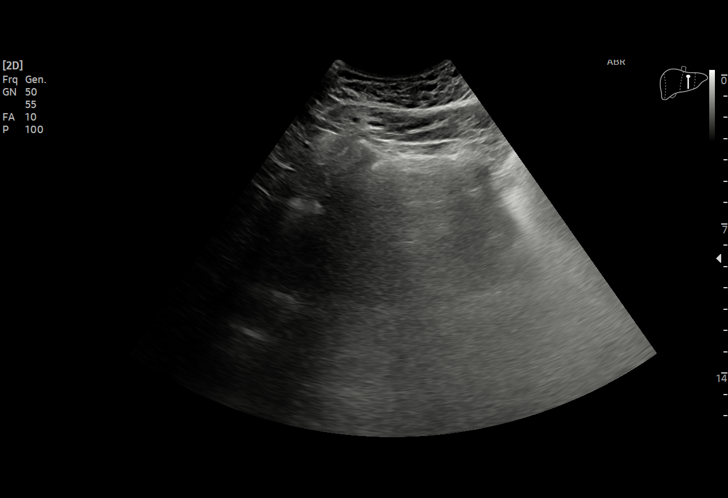
[im 20/32]
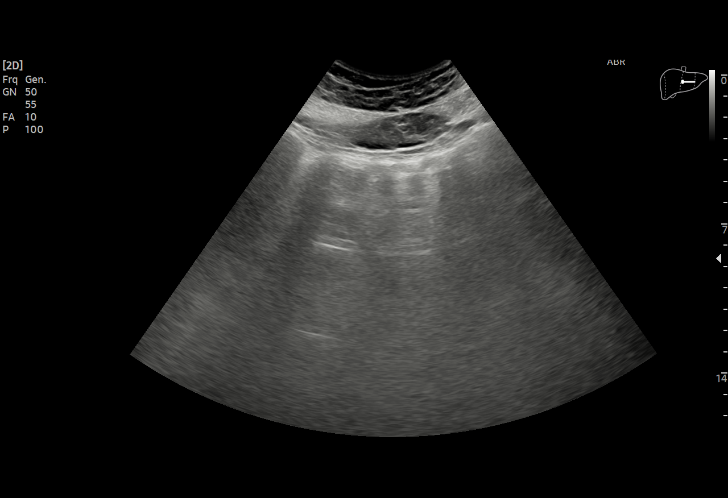
[im 22/32]
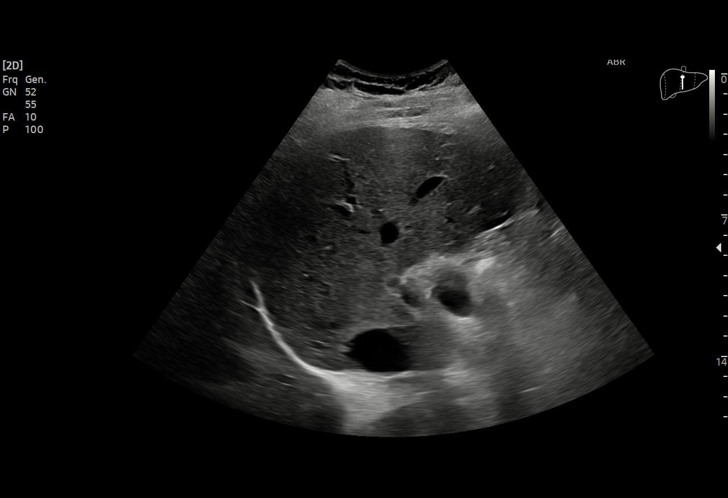
[im 25/32]
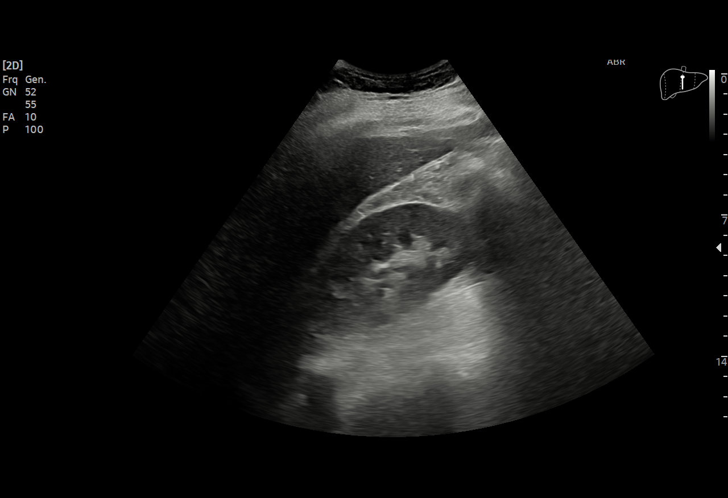
[im 26/32]
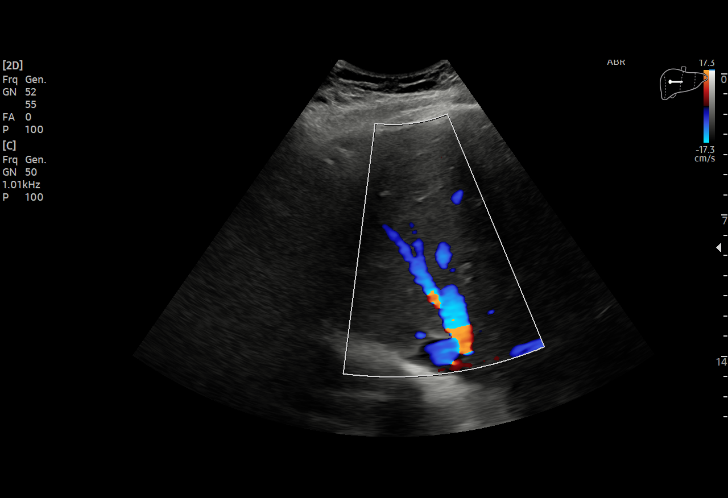
[im 29/32]
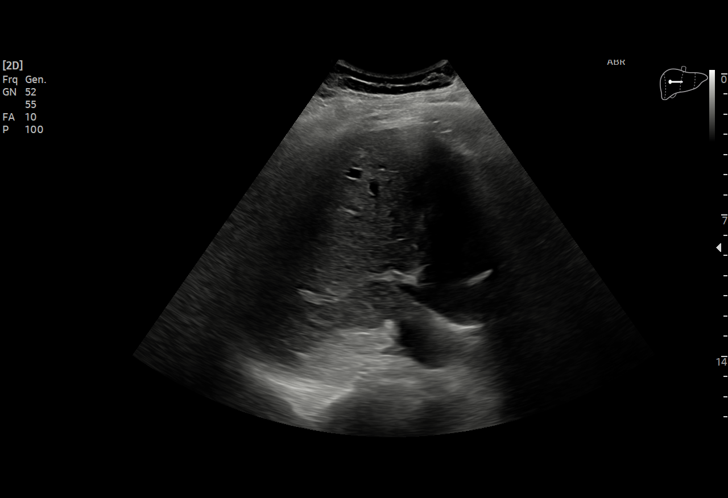
[im 32/32]
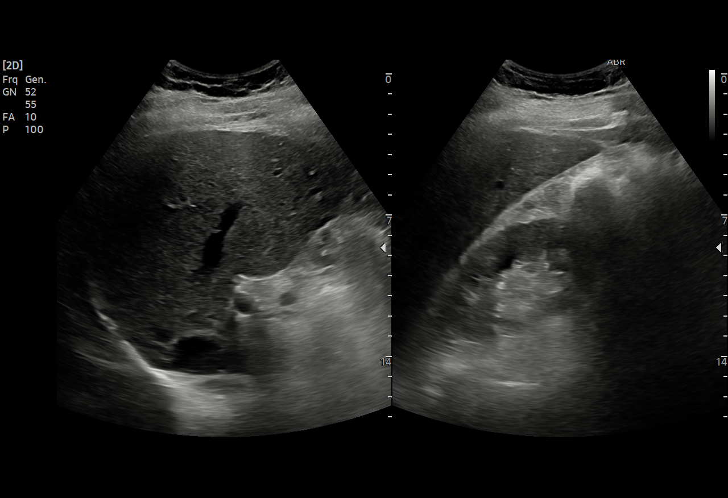

[15 of 25 positions shown; findings below may reference images not displayed]

FINDINGS: Gallbladder:

There is a stone within the gallbladder measuring 2.5 cm.
Gallbladder wall thickness is upper limits of normal measuring
between 2.5 and 3.0 mm. No pericholecystic fluid. Negative
sonographic Murphy's sign.

Common bile duct:

Diameter: 3 mm.

Liver:

No focal lesion identified. Within normal limits in parenchymal
echogenicity. Portal vein is patent on color Doppler imaging with
normal direction of blood flow towards the liver.

Other: None.
IMPRESSION: 1. Gallstone. Gallbladder wall thickness is upper limits of normal
measuring between 2.5 and 3 mm. No pericholecystic fluid or
sonographic Murphy's sign. Correlate for any clinical signs or
symptoms of early cholecystitis.

## 2023-09-27 ENCOUNTER — Encounter (INDEPENDENT_AMBULATORY_CARE_PROVIDER_SITE_OTHER): Payer: Self-pay | Admitting: Nurse Practitioner

## 2023-09-27 ENCOUNTER — Ambulatory Visit (INDEPENDENT_AMBULATORY_CARE_PROVIDER_SITE_OTHER): Admitting: Nurse Practitioner

## 2023-09-27 VITALS — BP 123/85 | HR 82 | Resp 18 | Wt 245.4 lb

## 2023-09-27 DIAGNOSIS — I87009 Postthrombotic syndrome without complications of unspecified extremity: Secondary | ICD-10-CM

## 2023-09-27 DIAGNOSIS — I890 Lymphedema, not elsewhere classified: Secondary | ICD-10-CM | POA: Diagnosis not present

## 2023-09-27 NOTE — Progress Notes (Signed)
 Subjective:    Patient ID: John Riddle, male    DOB: Oct 17, 1980, 43 y.o.   MRN: [id number] Chief Complaint  Patient presents with   New Patient (Initial Visit)    Ref Christean Grief consult left leg swelling and pain, hx of blood clots    John Riddle is a 43 year old male who presents today for a second opinion regarding his lower extremity edema.  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  Since that time he has been plagued with pain and swelling in the left lower extremity.  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  He notes that he was frustrated at the swelling and was looking for some sort of surgery or something that can be done to make the swelling go away.  He denies any current open wounds or ulcerations.  He denies currently weeping.  He notes that he has been diligent with wearing his medical grade compression socks for months.  He also elevates his lower extremity is much as possible.  He tries to be active but he notes that with more activity his swelling tends to be worse.    Review of Systems  Cardiovascular:  Positive for leg swelling.  All other systems reviewed and are negative.      Objective:   Physical Exam Vitals reviewed.  HENT:     Head: Normocephalic.  Cardiovascular:     Rate and Rhythm: Normal rate.  Pulmonary:     Effort: Pulmonary effort is normal.  Musculoskeletal:     Left lower leg: Edema present.  Skin:    General: Skin is warm and dry.  Neurological:     Mental Status: He is alert and oriented to person, place, and time.  Psychiatric:        Mood and Affect: Mood normal.        Behavior: Behavior normal.        Thought Content: Thought content normal.        Judgment: Judgment normal.     BP 123/85   Pulse 82   Resp 18   Wt 245 lb 6.4 oz  (111.3 kg)   BMI 33.28 kg/m   Past Medical History:  Diagnosis Date   MRSA cellulitis     Social History   Socioeconomic History   Marital status: Married    Spouse name: Not on file   Number of children: Not on file   Years of education: Not on file   Highest education level: Not on file  Occupational History   Not on file  Tobacco Use   Smoking status: Every Day    Current packs/day: 1.00    Types: Cigarettes   Smokeless tobacco: Never  Vaping Use   Vaping status: Never Used  Substance and Sexual Activity   Alcohol use: No   Drug use: No   Sexual activity: Not on file  Other Topics Concern   Not on file  Social History Narrative   Not on file   Social Drivers of Health   Financial Resource Strain: Not on file  Food Insecurity: Not on file  Transportation Needs: Not on file  Physical Activity: Not on file  Stress: Not on file  Social Connections: Not on file  Intimate Partner Violence: Not on file  Past Surgical History:  Procedure Laterality Date   HAND SURGERY Left    thumb    History reviewed. No pertinent family history.  Allergies  Allergen Reactions   Bactrim [Sulfamethoxazole-Trimethoprim] Anaphylaxis   Clindamycin/Lincomycin Rash   Codeine Nausea And Vomiting   Hydrocodone Hives       Latest Ref Rng & Units 12/25/2020    7:45 AM 03/12/2018    1:15 PM 02/14/2016    7:47 PM  CBC  WBC 4.0 - 10.5 K/uL 8.5  7.6  7.7   Hemoglobin 13.0 - 17.0 g/dL 16.1  09.6  04.5   Hematocrit 39.0 - 52.0 % 44.3  39.9  40.1   Platelets 150 - 400 K/uL 310  247  196       CMP     Component Value Date/Time   NA 138 12/25/2020 0745   NA 136 05/01/2013 1137   K 3.5 12/25/2020 0745   K 3.8 05/01/2013 1137   CL 102 12/25/2020 0745   CL 104 05/01/2013 1137   CO2 27 12/25/2020 0745   CO2 32 05/01/2013 1137   GLUCOSE 104 (H) 12/25/2020 0745   GLUCOSE 91 05/01/2013 1137   BUN 12 12/25/2020 0745   BUN 7 05/01/2013 1137   CREATININE 0.89 12/25/2020 0745    CREATININE 0.92 05/01/2013 1137   CALCIUM 9.2 12/25/2020 0745   CALCIUM 9.1 05/01/2013 1137   PROT 7.9 12/25/2020 0745   PROT 7.8 05/01/2013 1137   ALBUMIN 3.9 12/25/2020 0745   ALBUMIN 3.6 05/01/2013 1137   AST 14 (L) 12/25/2020 0745   AST 14 (L) 05/01/2013 1137   ALT 9 12/25/2020 0745   ALT 14 05/01/2013 1137   ALKPHOS 52 12/25/2020 0745   ALKPHOS 78 05/01/2013 1137   BILITOT 0.9 12/25/2020 0745   BILITOT 0.3 05/01/2013 1137   GFRNONAA >60 12/25/2020 0745   GFRNONAA >60 05/01/2013 1137     No results found.     Assessment & Plan:   1. Post-phlebitic syndrome (Primary) I agree with the assessment from Dr. Jacolyn Reedy the patient is likely dealing with postphlebitic symptoms and syndrome.  Based on noninvasive reflux studies done at [facility] it appears that he has deep venous insufficiency within the popliteal vein but there is no new or acute thrombus.  Based on that I had a long discussion regarding thrombectomy and why this would not be beneficial given that his previous thrombus is acute.  We also discussed that in the setting of deep venous insufficiency treatment is typically comprised of conservative therapies including use of medical grade compression, elevation and activity, which she is currently doing now.  I suspect that his swelling has been ongoing for some time he is likely developed a component of lymphedema and I think that with his postphlebitic issues and lymphedema pump would be helpful for him.  He was agreeable and we discussed this as noted below.  2. Lymphedema Recommend:  No surgery or intervention at this point in time.   The Patient is CEAP [id number].  The patient has been wearing compression for more than 12 weeks with no or little benefit.  The patient has been exercising daily for more than 12 weeks. The patient has been elevating and taking OTC pain medications for more than 12 weeks.  None of these have have eliminated the pain related to the lymphedema or the  discomfort regarding excessive swelling and venous congestion.    I have reviewed my discussion with the patient regarding lymphedema and  why it  causes symptoms.  Patient will continue wearing graduated compression on a daily basis. The patient should put the compression on first thing in the morning and removing them in the evening. The patient should not sleep in the compression.   In addition, behavioral modification throughout the day will be continued.  This will include frequent elevation (such as in a recliner), use of over the counter pain medications as needed and exercise such as walking.  The systemic causes for chronic edema such as liver, kidney and cardiac etiologies do not appear to have significant changed over the past year.    The patient has chronic , severe lymphedema with hyperpigmentation of the skin and has done MLD, skin care, medication, diet, exercise, elevation and compression for 4 weeks with no improvement,  I am recommending a lymphedema pump.  The patient still has stage 3 lymphedema and therefore, I believe that a lymph pump is needed to improve the control of the patient's lymphedema and improve the quality of life.  Additionally, a lymph pump is warranted because it will reduce the risk of cellulitis and ulceration in the future.  Patient should follow-up in six months    Current Outpatient Medications on File Prior to Visit  Medication Sig Dispense Refill   albuterol (VENTOLIN HFA) 108 (90 Base) MCG/ACT inhaler Inhale 1-2 puffs into the lungs every 6 (six) hours as needed.     rivaroxaban (XARELTO) 20 MG TABS tablet Take 20 mg by mouth daily with supper.     amoxicillin-clavulanate (AUGMENTIN) 875-125 MG tablet Take 1 tablet by mouth 2 (two) times daily. (Patient not taking: Reported on 09/27/2023) 14 tablet 0   azithromycin (ZITHROMAX) 250 MG tablet 1 tab by mouth daily (Patient not taking: Reported on 09/27/2023) 4 tablet 0   ondansetron (ZOFRAN ODT) 4 MG  disintegrating tablet Take 1 tablet (4 mg total) by mouth every 6 (six) hours as needed for nausea or vomiting. (Patient not taking: Reported on 09/27/2023) 20 tablet 0   predniSONE (DELTASONE) 50 MG tablet 1 tab daily (Patient not taking: Reported on 09/27/2023) 5 tablet 0   No current facility-administered medications on file prior to visit.    There are no Patient Instructions on file for this visit. No follow-ups on file.   Georgiana Spinner, NP

## 2024-03-28 ENCOUNTER — Encounter (INDEPENDENT_AMBULATORY_CARE_PROVIDER_SITE_OTHER): Payer: Self-pay

## 2024-03-28 ENCOUNTER — Ambulatory Visit (INDEPENDENT_AMBULATORY_CARE_PROVIDER_SITE_OTHER): Admitting: Nurse Practitioner
